# Patient Record
Sex: Female | Born: 1960 | Race: Asian | Hispanic: No | Smoking: Never smoker
Health system: Southern US, Community
[De-identification: ages and names within clinical notes are randomized; demographics above are authoritative.]

## PROBLEM LIST (undated history)

## (undated) DIAGNOSIS — C50412 Malignant neoplasm of upper-outer quadrant of left female breast: Principal | ICD-10-CM

## (undated) DIAGNOSIS — Z9010 Acquired absence of unspecified breast and nipple: Secondary | ICD-10-CM

## (undated) HISTORY — DX: Malignant neoplasm of upper-outer quadrant of left female breast: C50.412

## (undated) HISTORY — PX: DILATATION & CURETTAGE/HYSTEROSCOPY WITH MYOSURE: SHX6511

## (undated) HISTORY — DX: Acquired absence of unspecified breast and nipple: Z90.10

---

## 1998-01-02 ENCOUNTER — Other Ambulatory Visit: Admission: RE | Admit: 1998-01-02 | Discharge: 1998-01-02 | Payer: Self-pay | Admitting: Obstetrics and Gynecology

## 1998-02-11 ENCOUNTER — Inpatient Hospital Stay (HOSPITAL_COMMUNITY): Admission: AD | Admit: 1998-02-11 | Discharge: 1998-02-11 | Payer: Self-pay | Admitting: Obstetrics and Gynecology

## 1998-02-14 ENCOUNTER — Ambulatory Visit (HOSPITAL_COMMUNITY): Admission: RE | Admit: 1998-02-14 | Discharge: 1998-02-14 | Payer: Self-pay

## 1998-11-04 ENCOUNTER — Other Ambulatory Visit: Admission: RE | Admit: 1998-11-04 | Discharge: 1998-11-04 | Payer: Self-pay | Admitting: Obstetrics and Gynecology

## 1999-03-17 ENCOUNTER — Inpatient Hospital Stay (HOSPITAL_COMMUNITY): Admission: AD | Admit: 1999-03-17 | Discharge: 1999-03-17 | Payer: Self-pay | Admitting: Obstetrics and Gynecology

## 1999-03-18 ENCOUNTER — Inpatient Hospital Stay (HOSPITAL_COMMUNITY): Admission: AD | Admit: 1999-03-18 | Discharge: 1999-03-18 | Payer: Self-pay | Admitting: Obstetrics and Gynecology

## 1999-04-14 ENCOUNTER — Inpatient Hospital Stay (HOSPITAL_COMMUNITY): Admission: AD | Admit: 1999-04-14 | Discharge: 1999-04-14 | Payer: Self-pay | Admitting: Obstetrics and Gynecology

## 1999-05-31 ENCOUNTER — Inpatient Hospital Stay (HOSPITAL_COMMUNITY): Admission: AD | Admit: 1999-05-31 | Discharge: 1999-06-02 | Payer: Self-pay | Admitting: Obstetrics and Gynecology

## 1999-06-04 ENCOUNTER — Encounter: Admission: RE | Admit: 1999-06-04 | Discharge: 1999-09-02 | Payer: Self-pay | Admitting: Obstetrics and Gynecology

## 2000-07-07 ENCOUNTER — Other Ambulatory Visit: Admission: RE | Admit: 2000-07-07 | Discharge: 2000-07-07 | Payer: Self-pay | Admitting: Obstetrics and Gynecology

## 2003-01-18 ENCOUNTER — Other Ambulatory Visit: Admission: RE | Admit: 2003-01-18 | Discharge: 2003-01-18 | Payer: Self-pay | Admitting: Obstetrics and Gynecology

## 2003-12-04 ENCOUNTER — Encounter: Admission: RE | Admit: 2003-12-04 | Discharge: 2003-12-04 | Payer: Self-pay | Admitting: Allergy

## 2004-02-01 ENCOUNTER — Other Ambulatory Visit: Admission: RE | Admit: 2004-02-01 | Discharge: 2004-02-01 | Payer: Self-pay | Admitting: Obstetrics and Gynecology

## 2005-03-17 ENCOUNTER — Other Ambulatory Visit: Admission: RE | Admit: 2005-03-17 | Discharge: 2005-03-17 | Payer: Self-pay | Admitting: Obstetrics and Gynecology

## 2011-12-03 ENCOUNTER — Other Ambulatory Visit: Payer: Self-pay | Admitting: Obstetrics and Gynecology

## 2011-12-03 DIAGNOSIS — N6320 Unspecified lump in the left breast, unspecified quadrant: Secondary | ICD-10-CM

## 2011-12-07 ENCOUNTER — Ambulatory Visit
Admission: RE | Admit: 2011-12-07 | Discharge: 2011-12-07 | Disposition: A | Discharge status: Home / Self Care | Payer: 59 | Source: Ambulatory Visit | Attending: Obstetrics and Gynecology | Admitting: Obstetrics and Gynecology

## 2011-12-07 DIAGNOSIS — N6320 Unspecified lump in the left breast, unspecified quadrant: Secondary | ICD-10-CM

## 2013-09-21 ENCOUNTER — Encounter (INDEPENDENT_AMBULATORY_CARE_PROVIDER_SITE_OTHER): Payer: 59 | Admitting: Ophthalmology

## 2013-09-21 DIAGNOSIS — H35369 Drusen (degenerative) of macula, unspecified eye: Secondary | ICD-10-CM

## 2013-09-21 DIAGNOSIS — Q143 Congenital malformation of choroid: Secondary | ICD-10-CM

## 2013-09-21 DIAGNOSIS — H43819 Vitreous degeneration, unspecified eye: Secondary | ICD-10-CM

## 2013-09-21 DIAGNOSIS — H2510 Age-related nuclear cataract, unspecified eye: Secondary | ICD-10-CM

## 2014-06-21 ENCOUNTER — Other Ambulatory Visit: Payer: Self-pay | Admitting: Obstetrics and Gynecology

## 2014-06-21 DIAGNOSIS — R928 Other abnormal and inconclusive findings on diagnostic imaging of breast: Secondary | ICD-10-CM

## 2014-06-26 ENCOUNTER — Other Ambulatory Visit: Payer: Self-pay | Admitting: Obstetrics and Gynecology

## 2014-06-26 ENCOUNTER — Ambulatory Visit
Admission: RE | Admit: 2014-06-26 | Discharge: 2014-06-26 | Disposition: A | Discharge status: Home / Self Care | Payer: PRIVATE HEALTH INSURANCE | Source: Ambulatory Visit | Attending: Obstetrics and Gynecology | Admitting: Obstetrics and Gynecology

## 2014-06-26 DIAGNOSIS — R928 Other abnormal and inconclusive findings on diagnostic imaging of breast: Secondary | ICD-10-CM

## 2014-06-26 DIAGNOSIS — N6320 Unspecified lump in the left breast, unspecified quadrant: Secondary | ICD-10-CM

## 2014-06-26 IMAGING — US US BREAST*L* LIMITED INC AXILLA
1 series · 13 of 22 positions shown · non-contrast
Comparison: [DATE] and prior mammograms dating back to
[DATE].

CLINICAL DATA: 54-year-old female with possible left breast mass
and distortion identified on recent screening mammogram.

EXAM:
DIGITAL DIAGNOSTIC LEFT MAMMOGRAM WITH 3D TOMOSYNTHESIS WITH CAD
ULTRASOUND LEFT BREAST

[Series 1: advbreast · 13 of 22 slices shown]
[im 1/22]
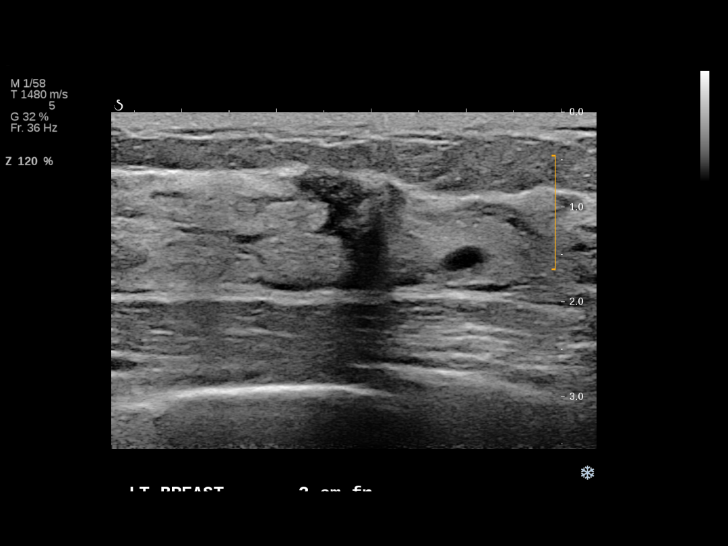
[im 3/22]
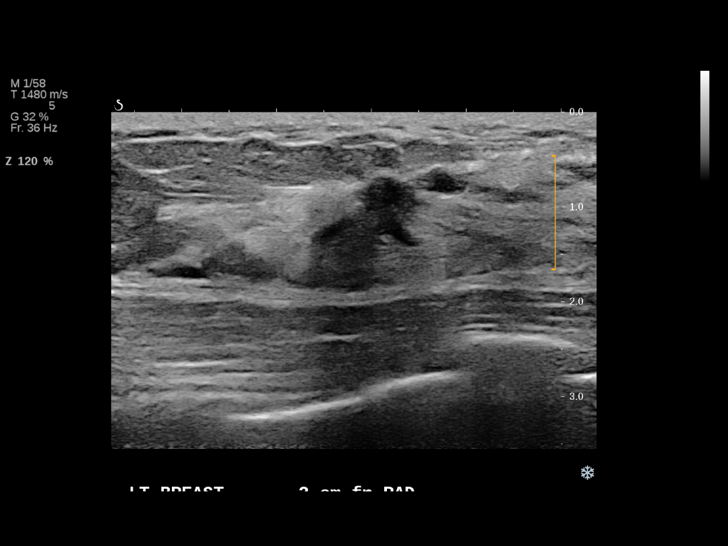
[im 5/22]
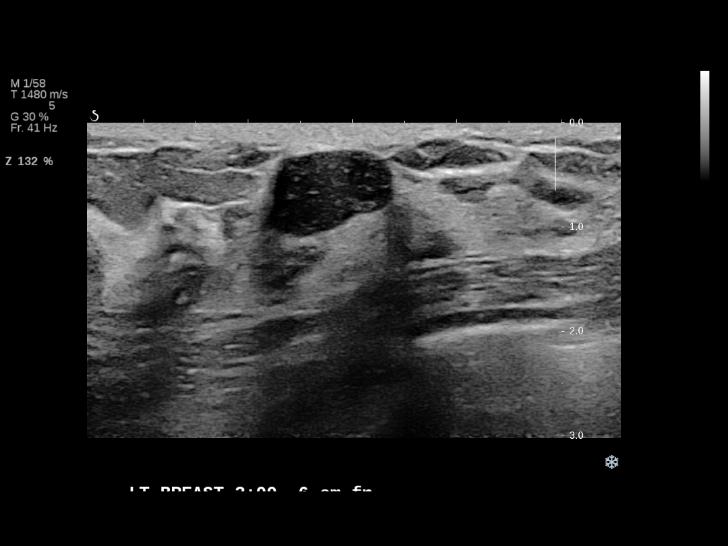
[im 6/22]
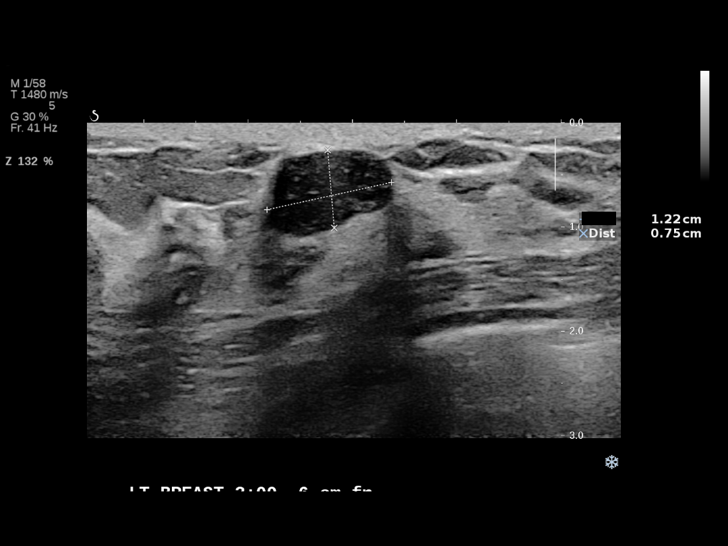
[im 8/22]
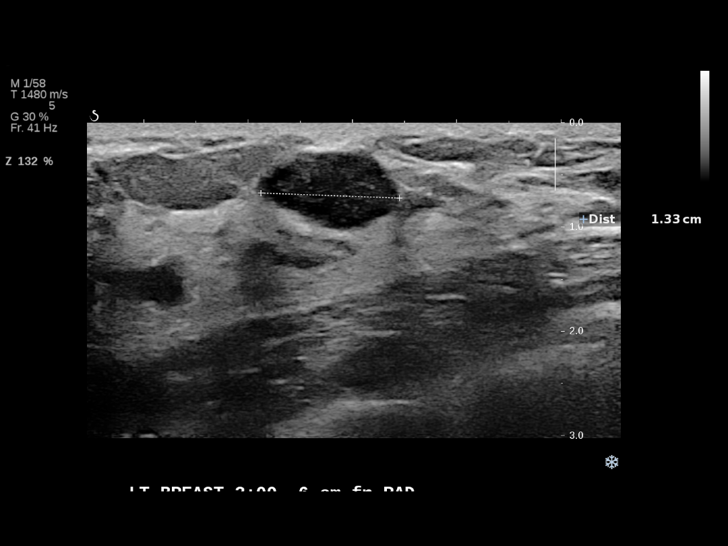
[im 10/22]
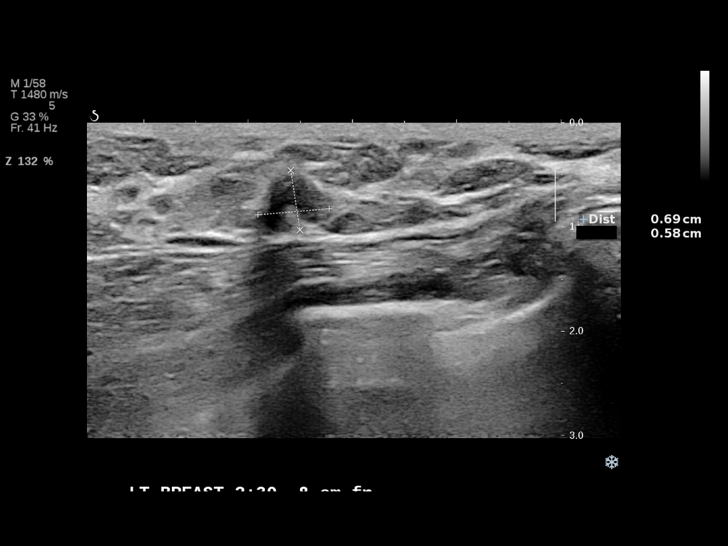
[im 12/22]
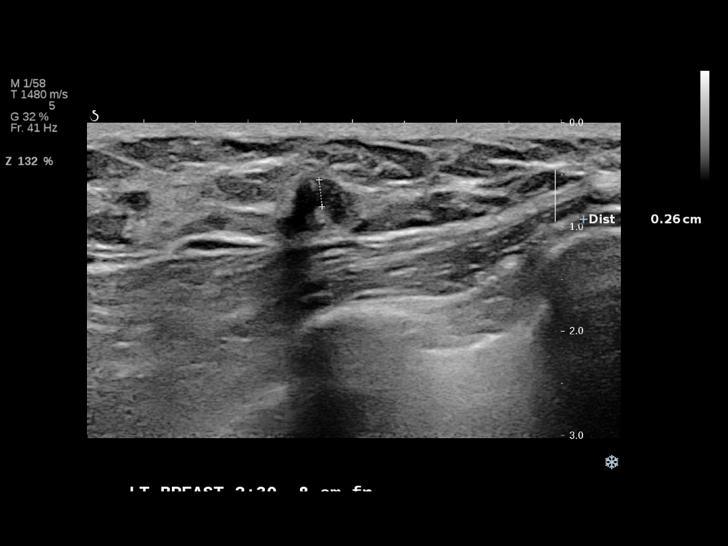
[im 13/22]
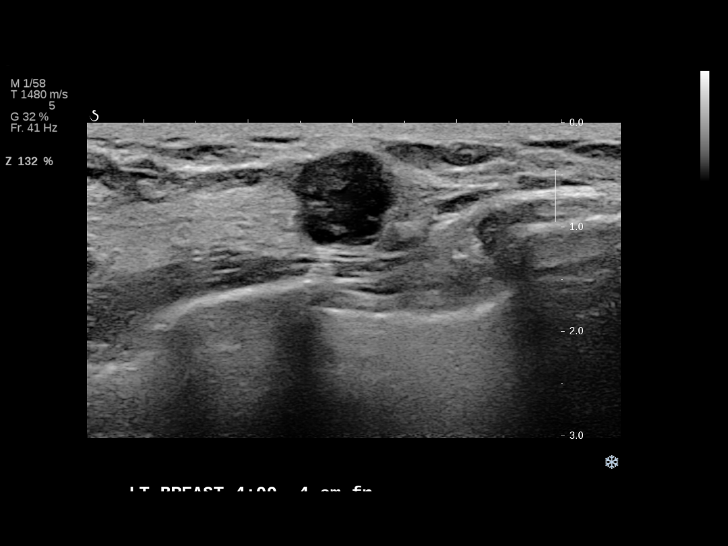
[im 15/22]
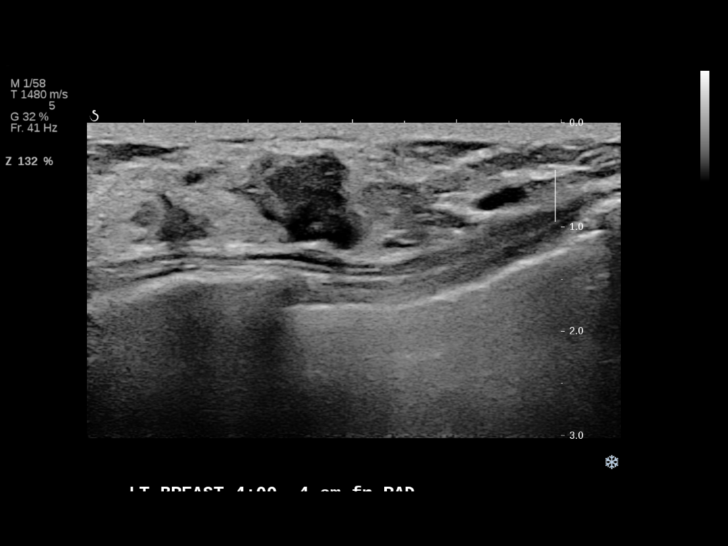
[im 17/22]
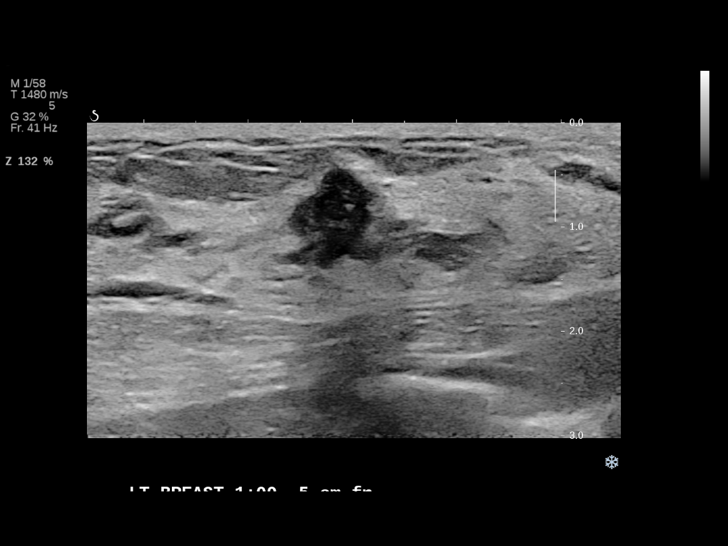
[im 18/22]
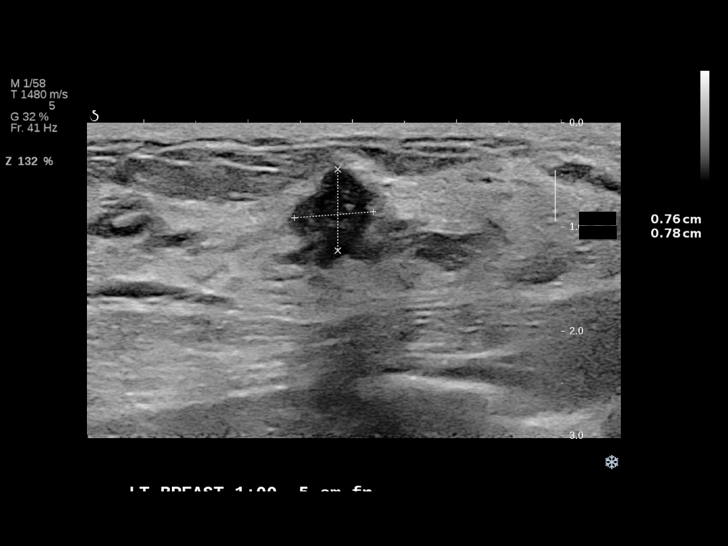
[im 20/22]
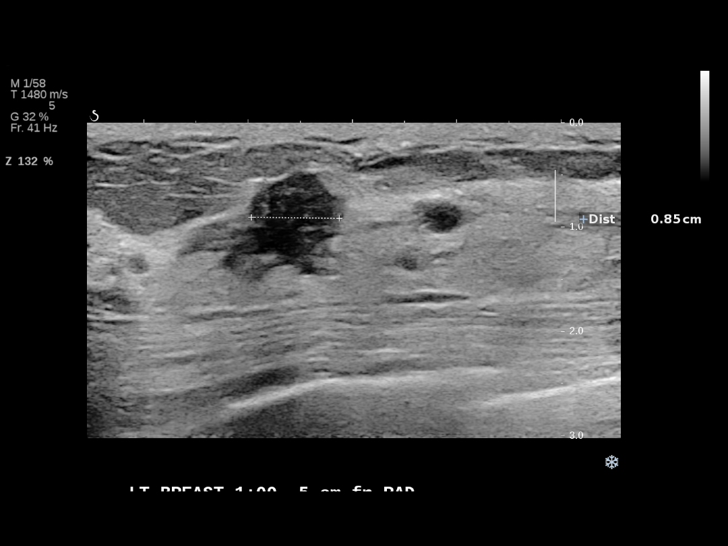
[im 22/22]
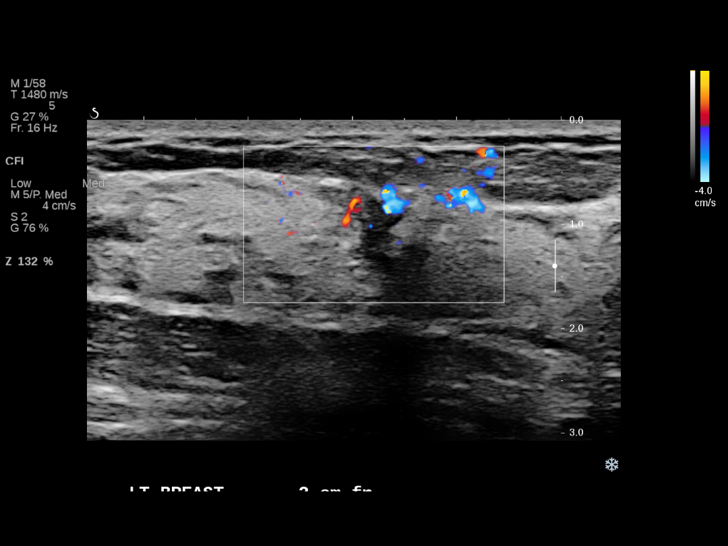

[13 of 22 positions shown; findings below may reference images not displayed]

[DATE] left breast ultrasound.

ACR Breast Density Category c: The breast tissue is heterogeneously
dense, which may obscure small masses.
FINDINGS: Routine 2D and 3D images of the left breast demonstrate a
circumscribed oval mass within the upper outer left breast and an
area of distortion within the upper left breast.

No suspicious calcifications are noted.

Mammographic images were processed with CAD.

On physical exam, nodularity throughout the left breast identified..

Ultrasound is performed, showing the following left breast findings:

A 1.2 x 1 x 1.3 cm suspicious irregular hypoechoic area/mass at the
[DATE] position 2 cm from the nipple, likely representing the
architectural distortion identified mammographically.

A 0.8 x 0.8 x 0.9 cm irregular hypoechoic mass at the 1 o'clock
position 5 cm from the nipple.

A 1 x 0.9 x 0.8 cm irregular hypoechoic mass at the 4 o'clock
position 4 cm from the nipple.

A mildly prominent intraparenchymal lymph node at the [DATE] position
of the left breast 8 cm from the nipple.

A 1.2 x 0.8 x 1.3 cm circumscribed oval hypoechoic parallel mass at
the 2 o'clock position of the left breast 6 cm from the nipple,
unchanged from [WR], compatible with a fibroadenoma.

No enlarged or abnormal appearing left axillary lymph nodes are
identified.
IMPRESSION: Suspicious masses at the [DATE] position, 1 o'clock position and 4
o'clock position of the left breast - tissue sampling of these
masses recommended. The [DATE] position mass probably represents the
architectural distortion identified mammographically.

No enlarged or abnormal appearing left axillary lymph nodes. Mildly
prominent intraparenchymal lymph node in the upper outer left
breast.

Stable fibroadenoma within the upper outer left breast,
corresponding to the mammographic abnormality.

RECOMMENDATION:
Ultrasound-guided biopsies of left breast masses as discussed above.
This has been scheduled for [DATE] and the patient informed.

I have discussed the findings and recommendations with the patient.
Results were also provided in writing at the conclusion of the
visit. If applicable, a reminder letter will be sent to the patient
regarding the next appointment.

BI-RADS CATEGORY  4: Suspicious.

## 2014-06-29 ENCOUNTER — Ambulatory Visit
Admission: RE | Admit: 2014-06-29 | Discharge: 2014-06-29 | Disposition: A | Discharge status: Home / Self Care | Payer: PRIVATE HEALTH INSURANCE | Source: Ambulatory Visit | Attending: Obstetrics and Gynecology | Admitting: Obstetrics and Gynecology

## 2014-06-29 ENCOUNTER — Other Ambulatory Visit: Payer: Self-pay | Admitting: Obstetrics and Gynecology

## 2014-06-29 DIAGNOSIS — N6320 Unspecified lump in the left breast, unspecified quadrant: Secondary | ICD-10-CM

## 2014-07-02 ENCOUNTER — Ambulatory Visit
Admission: RE | Admit: 2014-07-02 | Discharge: 2014-07-02 | Disposition: A | Discharge status: Home / Self Care | Payer: PRIVATE HEALTH INSURANCE | Source: Ambulatory Visit | Attending: Obstetrics and Gynecology | Admitting: Obstetrics and Gynecology

## 2014-07-02 ENCOUNTER — Other Ambulatory Visit: Payer: Self-pay | Admitting: Obstetrics and Gynecology

## 2014-07-02 DIAGNOSIS — N6320 Unspecified lump in the left breast, unspecified quadrant: Secondary | ICD-10-CM

## 2014-07-02 DIAGNOSIS — C50912 Malignant neoplasm of unspecified site of left female breast: Secondary | ICD-10-CM

## 2014-07-03 ENCOUNTER — Telehealth: Payer: Self-pay | Admitting: *Deleted

## 2014-07-03 ENCOUNTER — Encounter: Payer: Self-pay | Admitting: *Deleted

## 2014-07-03 ENCOUNTER — Other Ambulatory Visit: Payer: PRIVATE HEALTH INSURANCE

## 2014-07-03 DIAGNOSIS — C50412 Malignant neoplasm of upper-outer quadrant of left female breast: Secondary | ICD-10-CM

## 2014-07-03 DIAGNOSIS — Z170 Estrogen receptor positive status [ER+]: Secondary | ICD-10-CM | POA: Insufficient documentation

## 2014-07-03 HISTORY — DX: Malignant neoplasm of upper-outer quadrant of left female breast: C50.412

## 2014-07-03 NOTE — Telephone Encounter (Signed)
Confirmed [facility] for 07/11/14 at 8am .  Instructions and contact information given.

## 2014-07-04 ENCOUNTER — Ambulatory Visit
Admission: RE | Admit: 2014-07-04 | Discharge: 2014-07-04 | Disposition: A | Discharge status: Home / Self Care | Payer: BLUE CROSS/BLUE SHIELD | Source: Ambulatory Visit | Attending: Obstetrics and Gynecology | Admitting: Obstetrics and Gynecology

## 2014-07-04 DIAGNOSIS — C50912 Malignant neoplasm of unspecified site of left female breast: Secondary | ICD-10-CM

## 2014-07-04 MED ORDER — GADOBENATE DIMEGLUMINE 529 MG/ML IV SOLN
12.0000 mL | Freq: Once | INTRAVENOUS | Status: AC | PRN
  Administered 2014-07-04: 12 mL via INTRAVENOUS

## 2014-07-06 ENCOUNTER — Other Ambulatory Visit: Payer: Self-pay | Admitting: Obstetrics and Gynecology

## 2014-07-06 DIAGNOSIS — R928 Other abnormal and inconclusive findings on diagnostic imaging of breast: Secondary | ICD-10-CM

## 2014-07-09 ENCOUNTER — Ambulatory Visit
Admission: RE | Admit: 2014-07-09 | Discharge: 2014-07-09 | Disposition: A | Discharge status: Home / Self Care | Payer: BLUE CROSS/BLUE SHIELD | Source: Ambulatory Visit | Attending: Obstetrics and Gynecology | Admitting: Obstetrics and Gynecology

## 2014-07-09 ENCOUNTER — Other Ambulatory Visit: Payer: Self-pay | Admitting: Obstetrics and Gynecology

## 2014-07-09 ENCOUNTER — Encounter: Payer: Self-pay | Admitting: *Deleted

## 2014-07-09 DIAGNOSIS — R928 Other abnormal and inconclusive findings on diagnostic imaging of breast: Secondary | ICD-10-CM

## 2014-07-11 ENCOUNTER — Ambulatory Visit: Payer: BLUE CROSS/BLUE SHIELD

## 2014-07-11 ENCOUNTER — Encounter: Payer: Self-pay | Admitting: Hematology

## 2014-07-11 ENCOUNTER — Encounter: Payer: Self-pay | Admitting: Skilled Nursing Facility1

## 2014-07-11 ENCOUNTER — Ambulatory Visit (HOSPITAL_BASED_OUTPATIENT_CLINIC_OR_DEPARTMENT_OTHER): Payer: BLUE CROSS/BLUE SHIELD | Admitting: Hematology

## 2014-07-11 ENCOUNTER — Other Ambulatory Visit (HOSPITAL_BASED_OUTPATIENT_CLINIC_OR_DEPARTMENT_OTHER): Payer: BLUE CROSS/BLUE SHIELD

## 2014-07-11 ENCOUNTER — Ambulatory Visit
Admission: RE | Admit: 2014-07-11 | Discharge: 2014-07-11 | Disposition: A | Discharge status: Home / Self Care | Payer: BLUE CROSS/BLUE SHIELD | Source: Ambulatory Visit | Attending: Radiation Oncology | Admitting: Radiation Oncology

## 2014-07-11 ENCOUNTER — Ambulatory Visit: Payer: BLUE CROSS/BLUE SHIELD | Attending: General Surgery | Admitting: Physical Therapy

## 2014-07-11 VITALS — BP 127/70 | HR 73 | Temp 97.1°F | Resp 18 | Ht 64.5 in | Wt 128.9 lb

## 2014-07-11 DIAGNOSIS — C50412 Malignant neoplasm of upper-outer quadrant of left female breast: Secondary | ICD-10-CM

## 2014-07-11 DIAGNOSIS — N63 Unspecified lump in breast: Secondary | ICD-10-CM | POA: Diagnosis not present

## 2014-07-11 DIAGNOSIS — C50512 Malignant neoplasm of lower-outer quadrant of left female breast: Secondary | ICD-10-CM

## 2014-07-11 DIAGNOSIS — C50812 Malignant neoplasm of overlapping sites of left female breast: Secondary | ICD-10-CM

## 2014-07-11 DIAGNOSIS — Z170 Estrogen receptor positive status [ER+]: Secondary | ICD-10-CM

## 2014-07-11 LAB — CBC WITH DIFFERENTIAL/PLATELET
BASO%: 0.7 % (ref 0.0–2.0)
Basophils Absolute: 0 10*3/uL (ref 0.0–0.1)
EOS%: 3.8 % (ref 0.0–7.0)
Eosinophils Absolute: 0.2 10*3/uL (ref 0.0–0.5)
HEMATOCRIT: 40.2 % (ref 34.8–46.6)
HGB: 13.3 g/dL (ref 11.6–15.9)
LYMPH#: 1.7 10*3/uL (ref 0.9–3.3)
LYMPH%: 30.5 % (ref 14.0–49.7)
MCH: 29.8 pg (ref 25.1–34.0)
MCHC: 33 g/dL (ref 31.5–36.0)
MCV: 90.2 fL (ref 79.5–101.0)
MONO#: 0.3 10*3/uL (ref 0.1–0.9)
MONO%: 5.3 % (ref 0.0–14.0)
NEUT#: 3.3 10*3/uL (ref 1.5–6.5)
NEUT%: 59.7 % (ref 38.4–76.8)
Platelets: 263 10*3/uL (ref 145–400)
RBC: 4.46 10*6/uL (ref 3.70–5.45)
RDW: 12.6 % (ref 11.2–14.5)
WBC: 5.6 10*3/uL (ref 3.9–10.3)

## 2014-07-11 LAB — COMPREHENSIVE METABOLIC PANEL (CC13)
ALK PHOS: 45 U/L (ref 40–150)
ALT: 12 U/L (ref 0–55)
AST: 22 U/L (ref 5–34)
Albumin: 3.6 g/dL (ref 3.5–5.0)
Anion Gap: 7 mEq/L (ref 3–11)
BUN: 5.4 mg/dL — AB (ref 7.0–26.0)
CHLORIDE: 105 meq/L (ref 98–109)
CO2: 28 mEq/L (ref 22–29)
Calcium: 9.1 mg/dL (ref 8.4–10.4)
Creatinine: 0.6 mg/dL (ref 0.6–1.1)
EGFR: >90 mL/min/{1.73_m2} (ref >90)
GLUCOSE: 92 mg/dL (ref 70–140)
POTASSIUM: 3.6 meq/L (ref 3.5–5.1)
Sodium: 140 mEq/L (ref 136–145)
Total Bilirubin: 0.61 mg/dL (ref 0.20–1.20)
Total Protein: 6.3 g/dL — ABNORMAL LOW (ref 6.4–8.3)

## 2014-07-11 NOTE — Progress Notes (Signed)
Checked in new pt with no financial concerns prior to seeing the dr.  Informed pt if chemo is part of her treatment we will call her ins to see if Josem Kaufmann is req and will obtain it if it is as well as contact foundations that offer copay assistance for chemo if needed.  She has my card for any billing questions or concerns.

## 2014-07-11 NOTE — Progress Notes (Addendum)
[facility]  [phone] Fax:([phone]) [place] Note   Patient Care Team: Louretta Shorten, MD as PCP - General (Obstetrics and Gynecology) Louretta Shorten, MD as Consulting Physician (Obstetrics and Gynecology) Autumn Messing III, MD as Consulting Physician (General Surgery) Truitt Merle, MD as Consulting Physician (Hematology) Thea Silversmith, MD as Consulting Physician (Radiation Oncology) Rockwell Germany, RN as Registered Nurse Mauro Kaufmann, RN as Registered Nurse Holley Bouche, NP as Nurse Practitioner (Nurse Practitioner) 07/11/2014  CHIEF COMPLAINTS/PURPOSE OF CONSULTATION:  Newly diagnosed left breast cancer    Breast cancer of upper-outer quadrant of left female breast   06/26/2014 Breast US A 1 x 0.9 x 0.8 cm irregular hypoechoic mass at the 4 o'clock position 4 cm from the nipple.Suspicious masses at the 12:30 position, 1 o'clock position and 4 o'clock position of the left breast - tissue sampling of thesemasses recommended.    06/26/2014 Breast US A 1.2 x 1 x 1.3 cm suspicious irregular hypoechoic area/mass at the 12:30 position 2 cm from the nipple, likely representing the architectural distortion identified mammographically. A 0.8 x 0.8 x 0.9 cm irregular hypoechoic mass at the 1 o'clock    07/02/2014 Initial Biopsy 1. Breast, left, needle core biopsy, 1, 12:30 and 4  o'clock position - INVASIVE MAMMARY CARCINOMA. - MAMMARY CARCINOMA IN SITU.    07/02/2014 Receptors her2 Estrogen Receptor: 100%, POSITIVE, STRONG STAINING INTENSITY Progesterone Receptor: 98%, POSITIVE, STRONG STAINING INTENSITY Proliferation Marker Ki67: 10%, HER-2/NEU BY CISH - NEGATIVE.   07/03/2014 Initial Diagnosis Breast cancer of upper-outer quadrant of left female breast   07/10/2014 Breast MRI 1. Biopsy marker clip artifact in the 12:30 o'clock position of the left breast at the location of biopsy-proven invasive mammary carcinoma without a separate visible enhancing mass at this  time.    07/10/2014 Breast MRI 2. 1.3 cm biopsy-proven invasive mammary carcinoma in the midportion of the upper-outer quadrant of the left breast. 3. 1.3 cm biopsy-proven invasive mammary carcinoma in the posterior aspect of the lower outer quadrant of the left breast.    07/10/2014 Breast MRI 4. 0.8 cm rounded, mildly irregular enhancing mass in the posterior aspect of the upper-outer quadrant of the left breast, 0.8 cm similar-appearing mass in the posterior aspect of the lower outer quadrant of the left breast and adjacent 0.8 cm similar-   07/10/2014 Breast MRI 1.4 cm mildly irregular oval, enhancing mass in the anterior aspect of the lower inner quadrant of the right breast. This is suspicious for the possibility of malignancy.    HISTORY OF PRESENTING ILLNESS:  Stacy Gallagher 54 y.o. female is here because of newly diagnosed breast cancer  This was discovered by screening mammogram. Her last mammogram was 2 years ago which was normal. Her diagnostic mammogram showed 3 lesions in the left breast, measuring up to 1.3 cm, with the largest distance about 5.3 cm. 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 Ki-67 6-11%. [name] on 07/10/2014 showed 3 additional small lesions in the left breast and a 1.4 cm mass in the right breast, she is scheduled to have a right breast mass biopsy on April 18.   She denies any palpable mass in the breast or axilla before the screening. She feels very well overall, denies any pain, fatigue, change of her appetite or weight loss lately. She denies any other symptoms.  MEDICAL HISTORY:  Past  Medical History  Diagnosis Date  . Breast cancer of upper-outer quadrant of left female breast 07/03/2014    SURGICAL HISTORY: Past Surgical History  Procedure Laterality Date  . Dilatation & curettage/hysteroscopy with myosure     GYN HISTORY  Menarchal:  13 LMP: 06/17/2014 Contraceptive: HRT: n/a G3P2, 1 miscarriage  SOCIAL HISTORY: History   Social History  . Marital Status: Married    Spouse Name: N/A  . Number of Children: 2, age of 46 and 47   . Years of Education: N/A   Occupational History  .  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 Smoking status: Never Smoker   . Smokeless tobacco: Not on file  . Alcohol Use: No  . Drug Use: No  . Sexual Activity: Not on file   Other Topics Concern  . Not on file   Social History Narrative    FAMILY HISTORY: Family History  Problem Relation Age of Onset  . Colon cancer Maternal Aunt   . Cancer Maternal Aunt 65    colon cancer   . Throat cancer Maternal Grandmother   . Cancer Maternal Grandmother     ALLERGIES:  has No Known Allergies.  MEDICATIONS:  None  REVIEW OF SYSTEMS:   Constitutional: Denies fevers, chills or abnormal night sweats Eyes: Denies blurriness of vision, double vision or watery eyes Ears, nose, mouth, throat, and face: Denies mucositis or sore throat Respiratory: Denies cough, dyspnea or wheezes Cardiovascular: Denies palpitation, chest discomfort or lower extremity swelling Gastrointestinal:  Denies nausea, heartburn or change in bowel habits Skin: Denies abnormal skin rashes Lymphatics: Denies new lymphadenopathy or easy bruising Neurological:Denies numbness, tingling or new weaknesses Behavioral/Psych: Mood is stable, no new changes  All other systems were reviewed with the patient and are negative.  PHYSICAL EXAMINATION: ECOG PERFORMANCE STATUS: 0 - Asymptomatic  Filed Vitals:   07/11/14 0828  BP: 127/70  Pulse: 73  Temp: 97.1 F (36.2 C)  Resp: 18   Filed Weights   07/11/14 0828  Weight: 128 lb 14.4 oz (58.469 kg)    GENERAL:alert, no distress and comfortable SKIN: skin color, texture, turgor are normal, no rashes or significant lesions EYES: normal, conjunctiva are pink and non-injected,  sclera clear OROPHARYNX:no exudate, no erythema and lips, buccal mucosa, and tongue normal  NECK: supple, thyroid normal size, non-tender, without nodularity LYMPH:  no palpable lymphadenopathy in the cervical, axillary or inguinal LUNGS: clear to auscultation and percussion with normal breathing effort HEART: regular rate & rhythm and no murmurs and no lower extremity edema ABDOMEN:abdomen soft, non-tender and normal bowel sounds Musculoskeletal:no cyanosis of digits and no clubbing  PSYCH: alert & oriented x 3 with fluent speech NEURO: no focal motor/sensory deficits Breasts: Breast inspection showed them to be symmetrical with no nipple discharge or skin change. Palpation of the breasts and axilla revealed a 1.0X1.5cm mass at the upper outer quadrant of left breast (responding to the fibroadenoma on ultrasound ), but otherwise no other palpable mass that I could appreciate.   LABORATORY DATA:  I have reviewed the data as listed Lab Results  Component Value Date   WBC 5.6 07/11/2014   HGB 13.3 07/11/2014   HCT 40.2 07/11/2014   MCV 90.2 07/11/2014   PLT 263 07/11/2014    Recent Labs  07/11/14 0805  NA 140  K 3.6  CO2 28  GLUCOSE 92  BUN 5.4*  CREATININE 0.6  CALCIUM 9.1  PROT 6.3*  ALBUMIN 3.6  AST 22  ALT 12  ALKPHOS 45  BILITOT 0.61   Pathology results Diagnosis 1. Breast, left, needle core biopsy, 1 o'clock position - INVASIVE MAMMARY CARCINOMA. - MAMMARY CARCINOMA IN SITU. 2. Breast, left, needle core biopsy, 12:30 o'clock position - INVASIVE MAMMARY CARCINOMA. - MAMMARY CARCINOMA IN SITU. 3. Breast, left, needle core biopsy, 4 o'clock position - INVASIVE MAMMARY CARCINOMA. - MAMMARY CARCINOMA IN SITU.  Results: IMMUNOHISTOCHEMICAL AND MORPHOMETRIC ANALYSIS BY THE AUTOMATED CELLULAR IMAGING SYSTEM (ACIS) Estrogen Receptor: 100%, POSITIVE, STRONG STAINING INTENSITY Progesterone Receptor: 98%, POSITIVE, STRONG STAINING INTENSITY Proliferation Marker Ki67:  10% Results: HER-2/NEU BY CISH - NEGATIVE. RESULT RATIO OF HER2: CEP 17 SIGNALS 1.28 AVERAGE HER2 COPY NUMBER PER CELL 2.50  RADIOGRAPHIC STUDIES: I have personally reviewed the radiological images as listed and agreed with the findings in the report.  Mr Breast Bilateral W Wo Contrast 07/10/2014   ADDENDUM REPORT: 07/10/2014 15:03  ADDENDUM: The right breast mass measures 1.4 x 0.7 x 0.4 cm.    07/10/2014   IMPRESSION: 1. Biopsy marker clip artifact in the 12:30 o'clock position of the left breast at the location of biopsy-proven invasive mammary carcinoma without a separate visible enhancing mass at this time. 2. 1.3 cm biopsy-proven invasive mammary carcinoma in the midportion of the upper-outer quadrant of the left breast. 3. 1.3 cm biopsy-proven invasive mammary carcinoma in the posterior aspect of the lower outer quadrant of the left breast. 4. 0.8 cm rounded, mildly irregular enhancing mass in the posterior aspect of the upper-outer quadrant of the left breast, 0.8 cm similar-appearing mass in the posterior aspect of the lower outer quadrant of the left breast and adjacent 0.8 cm similar-appearing mass in the lower outer quadrant of the left breast. These are all suspicious for additional areas of malignancy. 5. Sonographically stable 1.5 cm probable fibroadenoma in the upper outer quadrant of the left breast. 6. 1.4 cm mildly irregular oval, enhancing mass in the anterior aspect of the lower inner quadrant of the right breast. This is suspicious for the possibility of malignancy.  RECOMMENDATION: Targeted ultrasound of the lower inner quadrant of the right breast and ultrasound-guided core needle biopsy of the 1.4 cm mass seen at that location on the MRI. If this cannot be seen sonographically, MR guided core needle biopsy would be recommended. We will schedule the ultrasound and possible ultrasound-guided core needle biopsy for the patient.  BI-RADS CATEGORY  4: Suspicious.  Electronically  Signed: By: Claudie Revering M.D. On: 07/05/2014 16:25    Mm Radiologist Eval And Mgmt  07/02/2014   CHIEF COMPLAINT: The patient presents to discuss results of left breast biopsies, 3 locations.  Current Pain Level: 1  EXAM: ESTABLISHED PATIENT OFFICE VISIT - LEVEL III  HISTORY OF PRESENT ILLNESS: The patient underwent recent imaging workup demonstrating 3 abnormalities in the left breast, for which she underwent ultrasound-guided core biopsy.  REVIEW OF SYSTEMS: The patient reports no problems at the biopsy site.  FINDINGS: At physical exam, there is minimal ecchymosis at the 1230 location biopsy site but no other skin abnormality identified in the left breast, with good healing of the biopsy sites overall.  PATHOLOGY: Pathology demonstrates invasive mammary carcinoma at all 3 biopsy locations. This is concordant with the imaging appearance.  ASSESSMENT AND PLAN: ASSESSMENT AND PLAN Breast MRI has been scheduled 07/04/2014. [facility] been scheduled 07/11/2014. The patient reports no problems and all questions were answered. She was provided with educational materials.  Electronically Signed   By: Conchita Paris M.D.   On: 07/02/2014 13:56   US Breast Ltd Uni Left Inc Axilla  06/26/2014   CLINICAL DATA:  54 year old female with possible left breast mass and distortion identified on recent screening mammogram.  EXAM: DIGITAL DIAGNOSTIC LEFT MAMMOGRAM WITH 3D TOMOSYNTHESIS WITH CAD  ULTRASOUND LEFT BREAST  COMPARISON:  06/19/2014 and prior mammograms dating back to 11/20/2009. 12/07/2011 left breast ultrasound.  ACR Breast Density Category c: The breast tissue is heterogeneously dense, which may obscure small masses.  FINDINGS: Routine 2D and 3D images of the left breast demonstrate a circumscribed oval mass within the upper outer left breast and an area of distortion within the upper left breast.  No suspicious calcifications are noted.  Mammographic images were processed  with CAD.  On physical exam, nodularity throughout the left breast identified.  Ultrasound is performed, showing the following left breast findings:  A 1.2 x 1 x 1.3 cm suspicious irregular hypoechoic area/mass at the 12:30 position 2 cm from the nipple, likely representing the architectural distortion identified mammographically.  A 0.8 x 0.8 x 0.9 cm irregular hypoechoic mass at the 1 o'clock position 5 cm from the nipple.  A 1 x 0.9 x 0.8 cm irregular hypoechoic mass at the 4 o'clock position 4 cm from the nipple.  A mildly prominent intraparenchymal lymph node at the 2:30 position of the left breast 8 cm from the nipple.  A 1.2 x 0.8 x 1.3 cm circumscribed oval hypoechoic parallel mass at the 2 o'clock position of the left breast 6 cm from the nipple, unchanged from 2013, compatible with a fibroadenoma.  No enlarged or abnormal appearing left axillary lymph nodes are identified.   IMPRESSION: Suspicious masses at the 12:30 position, 1 o'clock position and 4 o'clock position of the left breast - tissue sampling of these masses recommended. The 12:30 position mass probably represents the architectural distortion identified mammographically.  No enlarged or abnormal appearing left axillary lymph nodes. Mildly prominent intraparenchymal lymph node in the upper outer left breast.  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  RECOMMENDATION: Ultrasound-guided biopsies of left breast masses as discussed above. This has been scheduled for 06/29/2014 and the patient informed.  I have discussed the findings and recommendations with the patient. Results were also provided in writing at the conclusion of the visit. If applicable, a reminder letter will be sent to the patient regarding the next appointment.  BI-RADS CATEGORY  4: Suspicious.   Electronically Signed   By: Margarette Canada M.D.   On: 06/26/2014 15:46   US Breast Ltd Uni Right Inc Axilla  07/09/2014    CLINICAL DATA:  Recent diagnosis of left breast cancer. Second-look ultrasound for abnormal enhancement in right breast seen on MRI.  EXAM: ULTRASOUND OF THE RIGHT BREAST  COMPARISON:  Previous exam(s).  FINDINGS: Targeted ultrasound is performed, showing no definite abnormal solid lesion that would correlate to the MRI finding.  IMPRESSION: Suspicious MRI finding of the right breast.  RECOMMENDATION: MRI guided core biopsy right breast lesion.  I have discussed the findings and recommendations with the patient. Results were also provided in writing at the conclusion of the visit. If applicable, a reminder letter will be sent to the patient regarding the next appointment.  BI-RADS CATEGORY  4: Suspicious abnormality - biopsy should be considered.   Electronically Signed   By: Abelardo Diesel M.D.   On: 07/09/2014 12:49   Mm Diag  Breast Tomo Uni Left  06/26/2014   CLINICAL DATA:  54 year old female with possible left breast mass and distortion identified on recent screening mammogram.  EXAM: DIGITAL DIAGNOSTIC LEFT MAMMOGRAM WITH 3D TOMOSYNTHESIS WITH CAD  ULTRASOUND LEFT BREAST  COMPARISON:  06/19/2014 and prior mammograms dating back to 11/20/2009. 12/07/2011 left breast ultrasound.  ACR Breast Density Category c: The breast tissue is heterogeneously dense, which may obscure small masses.  FINDINGS: Routine 2D and 3D images of the left breast demonstrate a circumscribed oval mass within the upper outer left breast and an area of distortion within the upper left breast.  No suspicious calcifications are noted.  Mammographic images were processed with CAD.  On physical exam, nodularity throughout the left breast identified.  Ultrasound is performed, showing the following left breast findings:  A 1.2 x 1 x 1.3 cm suspicious irregular hypoechoic area/mass at the 12:30 position 2 cm from the nipple, likely representing the architectural distortion identified mammographically.  A 0.8 x 0.8 x 0.9 cm irregular hypoechoic  mass at the 1 o'clock position 5 cm from the nipple.  A 1 x 0.9 x 0.8 cm irregular hypoechoic mass at the 4 o'clock position 4 cm from the nipple.  A mildly prominent intraparenchymal lymph node at the 2:30 position of the left breast 8 cm from the nipple.  A 1.2 x 0.8 x 1.3 cm circumscribed oval hypoechoic parallel mass at the 2 o'clock position of the left breast 6 cm from the nipple, unchanged from 2013, compatible with a fibroadenoma.  No enlarged or abnormal appearing left axillary lymph nodes are identified.  IMPRESSION: Suspicious masses at the 12:30 position, 1 o'clock position and 4 o'clock position of the left breast - tissue sampling of these masses recommended. The 12:30 position mass probably represents the architectural distortion identified mammographically.  No enlarged or abnormal appearing left axillary lymph nodes. Mildly prominent intraparenchymal lymph node in the upper outer left breast.  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  RECOMMENDATION: Ultrasound-guided biopsies of left breast masses as discussed above. This has been scheduled for 06/29/2014 and the patient informed.  I have discussed the findings and recommendations with the patient. Results were also provided in writing at the conclusion of the visit. If applicable, a reminder letter will be sent to the patient regarding the next appointment.  BI-RADS CATEGORY  4: Suspicious.   Electronically Signed   By: Margarette Canada M.D.   On: 06/26/2014 15:46     ASSESSMENT & PLAN:  54 year old premenopausal female without significant past medical history, was found to have bilateral breast masses on the screening mammogram.  1. Left multifocal invasive ductal carcinoma, [id number], Stage IA, ER/PR strongly positive, HER-2 negative, Ki-67 -11%, and background DCIS, grade 2 -She has about 6 lesions in the left breast on the MRI. 3 of them were biopsied, which she wore  morphologically similar, with strong ER/PR expression and low Ki-67. -We discussed her imaging findings and the biopsy results in great details. -Giving the multifocal lesions in the left breast, she is likely need a left mastectomy. She was seen by Dr. Marlou Starks  Today. -I recommend a Oncotype Dx test on the sample with the highest Ki-67, and we'll make a decision about adjuvant chemotherapy based on the Oncotype result. -Giving the strong ER and PR expression, I recommend adjuvant endocrine therapy with tamoxifen. If she experience menopause when she is on tamoxifen, I may switch her to aromatase inhibitor for additional 5  years. -She was also seen by radiation oncologist Dr. Pablo Ledger today.  2. Right breast lesion -She has a 1.4 cm breast region on [name]. -She is scheduled to have a biopsy next week. -Further recommendation will based on the biopsy results and molecule features.  Follow-up: -If her right breast lesion biopsy shows invasive breast cancer, and with different ER/PR/HER2 status than left breast cancer, then I'll meet her again next week. Otherwise I'll plan to see her back 3 weeks after her surgery to discuss her Oncotype results.   All questions were answered. The patient knows to call the clinic with any problems, questions or concerns. I spent 55 minutes counseling the patient face to face. The total time spent in the appointment was 60 minutes and more than 50% was on counseling.     Truitt Merle, MD 07/11/2014 1:18 PM

## 2014-07-11 NOTE — Patient Instructions (Signed)

## 2014-07-11 NOTE — Therapy (Signed)
[place], [place], [address] Phone: [phone]   Fax:  [phone]  Physical Therapy Evaluation  Patient Details  Name: Stacy Gallagher MRN: [id number] Date of Birth: 03-30-1961 Referring Provider:  Jovita Kussmaul, MD  Encounter Date: 07/11/2014      PT End of Session - 07/11/14 1214    Visit Number 1   Number of Visits 1   PT Start Time 9292   PT Stop Time 1100   PT Time Calculation (min) 25 min   Activity Tolerance Patient tolerated treatment well   Behavior During Therapy [facility] for tasks assessed/performed      Past Medical History  Diagnosis Date  . Breast cancer of upper-outer quadrant of left female breast 07/03/2014    Past Surgical History  Procedure Laterality Date  . Dilatation & curettage/hysteroscopy with myosure      There were no vitals filed for this visit.  Visit Diagnosis:  Carcinoma of upper-outer quadrant of left female breast - Plan: PT plan of care cert/re-cert      Subjective Assessment - 07/11/14 1204    Subjective Patient was seen today for a baseline assessment of her newly diagnosed left and possible right breast cancer.   Patient is accompained by: Family member   Pertinent History Diagnosed with left upper outer breast cancer on 07/02/14.  She then underwent an MRI and was found to have 3 masses in her left breast and a suspicious area in her right breast which will biopsied on 07/16/14.  Her left breast masses are located at 12:30, 1:30, and 4:30.  Ki67 varies from 6-11% in those masses.   Patient Stated Goals Learn shoulder ROM HEP and reduce lymphedema risk   Currently in Pain? No/denies            [facility] PT Assessment - 07/11/14 0001    Assessment   Medical Diagnosis Left breast cancer   Onset Date 07/02/14   Precautions   Precautions Other (comment)  Active breast cancer   Restrictions   Weight Bearing Restrictions No   Balance Screen   Has the patient  fallen in the past 6 months No   Has the patient had a decrease in activity level because of a fear of falling?  No   Is the patient reluctant to leave their home because of a fear of falling?  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, sits at desk   Leisure She does cardio 3-4x/wk for 15-20 min   Cognition   Overall Cognitive Status Within Functional Limits for tasks assessed   Posture/Postural Control   Posture/Postural Control No significant limitations   ROM / Strength   AROM / PROM / Strength AROM;Strength   AROM   AROM Assessment Site Shoulder   Right/Left Shoulder Right;Left   Right Shoulder Extension 52 Degrees   Right Shoulder Flexion 155 Degrees   Right Shoulder ABduction 175 Degrees   Right Shoulder Internal Rotation 73 Degrees   Right Shoulder External Rotation 86 Degrees   Left Shoulder Extension 52 Degrees   Left Shoulder Flexion 154 Degrees   Left Shoulder ABduction 169 Degrees   Left Shoulder Internal  Rotation 65 Degrees   Left Shoulder External Rotation 86 Degrees   Strength   Overall Strength Within functional limits for tasks performed           LYMPHEDEMA/ONCOLOGY QUESTIONNAIRE - 07/11/14 1211    Type   Cancer Type Left and possibly right breast cancer   Lymphedema Assessments   Lymphedema Assessments Upper extremities   Right Upper Extremity Lymphedema   10 cm Proximal to Olecranon Process 25.8 cm   Olecranon Process 22.4 cm   10 cm Proximal to Ulnar Styloid Process 20.6 cm   Just Proximal to Ulnar Styloid Process 14.4 cm   Across Hand at [employer] 17.9 cm   At [place] of 2nd Digit 5.8 cm   Left Upper Extremity Lymphedema   10 cm Proximal  to Olecranon Process 26.1 cm   Olecranon Process 22.5 cm   10 cm Proximal to Ulnar Styloid Process 19.6 cm   Just Proximal to Ulnar Styloid Process 14.4 cm   Across Hand at [employer] 18 cm   At [place] of 2nd Digit 5.5 cm       Patient was instructed today in a home exercise program today for post op shoulder range of motion. These included active assist shoulder flexion in sitting, scapular retraction, wall walking with shoulder abduction, and hands behind head external rotation.  She was encouraged to do these twice a day, holding 3 seconds and repeating 5 times when permitted by her physician.         PT Education - 07/11/14 1213    Education provided Yes   Education Details Post op shoulder ROM HEP and lymphedema risk reduction   Person(s) Educated Patient;Other (comment)  sister   Methods Explanation;Demonstration;Handout   Comprehension Verbalized understanding;Returned demonstration              Breast Clinic Goals - 07/11/14 1221    Patient will be able to verbalize understanding of pertinent lymphedema risk reduction practices relevant to her diagnosis specifically related to skin care.   Time 1   Period Days   Status Achieved   Patient will be able to return demonstrate and/or verbalize understanding of the post-op home exercise program related to regaining shoulder range of motion.   Time 1   Period Days   Status Achieved   Patient will be able to verbalize understanding of the importance of attending the postoperative After Breast Cancer Class for further lymphedema risk reduction education and therapeutic exercise.   Time 1   Period Days   Status Achieved              Plan - 07/11/14 1214    Clinical Impression Statement Patient was seen today for a baseline assessment of her newly diagnosed left breast cancer.  Her left breast has masses in various quadrants and will plan to have a left mastectomy and sentinel node biopsy with possible immediate  reconstruction.  Her right breast has an area that will be biopsied on 07/16/14.  The treatment plan for her right side will be determined after those results.  She will have oncotype testing and and anti-estrogen therapy.  She will benefit from physical therapy post-operatively to regain shoulder ROM and strength and prevent lymphedema.   Pt will benefit from skilled therapeutic intervention in order to improve on the following deficits Decreased range of motion;Increased edema;Pain;Impaired UE functional use;Decreased strength;Decreased knowledge of precautions   Rehab Potential Excellent   Clinical Impairments Affecting Rehab Potential none  PT Frequency One time visit   PT Treatment/Interventions Patient/family education;Therapeutic exercise   Consulted and Agree with Plan of Care Patient;Family member/caregiver   Family Member Consulted sister     Patient will follow up at outpatient cancer rehab if needed following surgery.  If the patient requires physical therapy at that time, a specific plan will be dictated and sent to the referring physician for approval. The patient was educated today on appropriate basic range of motion exercises to begin post operatively and the importance of attending the After Breast Cancer class following surgery.  Patient was educated today on lymphedema risk reduction practices as it pertains to recommendations that will benefit the patient immediately following surgery.  She verbalized good understanding.  No additional physical therapy is indicated at this time.       Problem List Patient Active Problem List   Diagnosis Date Noted  . Breast cancer of upper-outer quadrant of left female breast 07/03/2014    Annia Friendly, PT 07/11/2014, 12:24 PM  [place], [place], [address] Phone: [phone]   Fax:  [phone]

## 2014-07-11 NOTE — Progress Notes (Signed)
Ms. Eagleson is a very pleasant 54 y.o. 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  There are 3 confirmed foci of disease identified in the left breast. Biopsy results revealed the tumors' prognostic profiles are all ER positive, PR positive, and HER2/neu negative. Ki67 ranges from 6-11%.  She presents today with her sister to the [facility]) for treatment consideration and recommendations from the breast surgeon, radiation oncologist, and medical oncologist.     I briefly met with Ms. Maino and her sister during her [facility] visit today. We discussed the purpose of the Survivorship Clinic, which will include monitoring for recurrence, coordinating completion of age and gender-appropriate cancer screenings, promotion of overall wellness, as well as managing potential late/long-term side effects of anti-cancer treatments.    The treatment plan for Ms. Zelada will likely include surgery, radiation therapy, and anti-estrogen therapy.  As of today, the intent of treatment for Ms. Domanski is cure, therefore she will be eligible for the Survivorship Clinic upon her completion of treatment.  Her survivorship care plan (SCP) document will be drafted and updated throughout the course of her treatment trajectory. She will receive the SCP in an office visit with myself in the Survivorship Clinic once she has completed treatment.   Ms. Saxe was encouraged to ask questions and all questions were answered to her satisfaction.  She was given my business card and encouraged to contact me with any concerns regarding survivorship.  I look forward to participating in her care.   Mike Craze, NP [place] [phone]

## 2014-07-11 NOTE — Progress Notes (Signed)
Subjective:     Patient ID: Stacy Gallagher, female   DOB: 22-Mar-1961, 54 y.o.   MRN: [id number]  HPI   Review of Systems     Objective:   Physical Exam For the patient to understand and be given the tools to implement a healthy plant based diet during their cancer diagnosis.     Assessment:     Patient was seen today and found to be pleasant and accompanied by her seemingly supportive sister. Pts left breast affected. Current labs: total protein 6.3 BUN 5.4.  Pts weight 128 pounds BMI 21.8 5'4''. Pt states she loves steak and feels she gets enough physical activity.      Plan:     Dietitian educated the patient on implementing a plant based diet by incorporating more plant proteins, fruits, and vegetables. As a part of a healthy routine physical activity was discussed. Dietitian explained the low protein labs and educated on the importance of consuming enough protein throughout the day. The importance of legitimate, evidence based information was discussed and examples were given. A folder of evidence based information with a focus on a plant based diet and general nutrition during cancer was given to the patient.  As a part of the continuum of care the cancer dietitian's contact information was given to the patient in the event they would like to have a follow up appointment.

## 2014-07-11 NOTE — Progress Notes (Signed)
  Radiation Oncology         [phone]) [phone] ________________________________  Initial Outpatient Consultation - Date: 07/11/2014   Name: Stacy Gallagher MRN: [id number]   DOB: April 20, 1960  REFERRING PHYSICIAN: Jovita Kussmaul, MD  DIAGNOSIS:    ICD-9-CM ICD-10-CM   1. Breast cancer of upper-outer quadrant of left female breast 174.4 C50.412     STAGE: No matching staging information was found for the patient.  HISTORY OF PRESENT ILLNESS::Stacy Gallagher is a 54 y.o. female  Was found to have distortion and a mass in her left breast on screening mammogram ultrasound showed 3 masses in the left breast . All of these were biopsied and the largest measured 1.3 cm. The clips were 5.3 cm apart. MRI showed multiple masses in the left breast including the largest at 1.3 cm.  In addition to the 3 known masses 3 additional masses were seen with the largest measuring 8 mm. A 1.4 cm mass was seen in the right breast. MRI biopsy of this mass is scheduled for 4/18. Biopsy of the known masses in the left breast showed similar appearing IDC which was Grade 2 and ER+PR+HEr2- with a Ki67 of 6-11%. She is GxP2 with menses at 12. She has done well with her biopsy. She is accompanied by her husband. She is still menstruating.   PREVIOUS RADIATION THERAPY: No  Past medical, social and family history were reviewed in the electronic chart. Review of symptoms was reviewed in the electronic chart. Medications were reviewed in the electronic chart.   PHYSICAL EXAM: There were no vitals filed for this visit.. . Pleasant female. No distress. Appears younger than her stated age.   IMPRESSION: Multifocal T1N0 Invasive Ductal Carcinoma of the Left Breast  PLAN: I spoke to the patient today regarding her diagnosis and options for treatment. We discussed the equivalence in terms of survival and local failure between mastectomy and breast conservation. We discussed the role of radiation in decreasing local failures in patients who  undergo mastectomy and have risk factors for recurrence including positive lymph nodes and/or tumors over 5 cm and/or positive margins. We discussed the process of simulation and the placement tattoos. We discussed 6 weeks of treatment as an outpatient. We discussed the possibility of asymptomatic lung damage. We discussed the low likelihood of secondary malignancies. We discussed the possible side effects including but not limited to skin redness, fatigue, permanent skin darkening, and chest wall swelling. We discussed increased complications that can occur with reconstruction after radiation.    She will require a mastectomy on the left and could have immediate reconstruction.   I spent 20 minutes  face to face with the patient and more than 50% of that time was spent in counseling and/or coordination of care.   ------------------------------------------------  Thea Silversmith, MD  j

## 2014-07-16 ENCOUNTER — Ambulatory Visit: Payer: BLUE CROSS/BLUE SHIELD

## 2014-07-16 ENCOUNTER — Ambulatory Visit
Admission: RE | Admit: 2014-07-16 | Discharge: 2014-07-16 | Disposition: A | Discharge status: Home / Self Care | Payer: BLUE CROSS/BLUE SHIELD | Source: Ambulatory Visit | Attending: Obstetrics and Gynecology | Admitting: Obstetrics and Gynecology

## 2014-07-16 DIAGNOSIS — R928 Other abnormal and inconclusive findings on diagnostic imaging of breast: Secondary | ICD-10-CM

## 2014-07-16 MED ORDER — GADOBENATE DIMEGLUMINE 529 MG/ML IV SOLN
12.0000 mL | Freq: Once | INTRAVENOUS | Status: AC | PRN
  Administered 2014-07-16: 12 mL via INTRAVENOUS

## 2014-07-17 ENCOUNTER — Telehealth: Payer: Self-pay | Admitting: *Deleted

## 2014-07-17 NOTE — Telephone Encounter (Signed)
Spoke with patient yesterday from [facility] 07/11/14.  She is doing well.  Confirmed appointment for her for Dr. Iran Planas for 4/19 at 245pm.  Encouraged her to call with any needs or concerns.

## 2014-07-24 ENCOUNTER — Encounter: Payer: Self-pay | Admitting: *Deleted

## 2014-07-24 ENCOUNTER — Telehealth: Payer: Self-pay | Admitting: *Deleted

## 2014-07-24 NOTE — Telephone Encounter (Signed)
Spoke with patient to follow up.  She states she is being referred to [facility] to get more information on DIEP flap. She states she will make a surgery decision after the appointment at [facility] on what type and where she will have her surgery.  Encouraged her to call with any needs or concerns.

## 2014-08-06 ENCOUNTER — Telehealth: Payer: Self-pay | Admitting: *Deleted

## 2014-08-06 NOTE — Telephone Encounter (Signed)
Left message for a return phone call to follow up with patient.   Awaiting patient response

## 2014-08-13 ENCOUNTER — Encounter: Payer: Self-pay | Admitting: Hematology

## 2014-08-29 HISTORY — PX: MASTECTOMY: SHX3

## 2014-10-04 ENCOUNTER — Telehealth: Payer: Self-pay | Admitting: *Deleted

## 2014-10-04 NOTE — Telephone Encounter (Signed)
Spoke with patient to follow up.  She states she had a DIEP at [facility] in June.  She is still recovering and didn't realize how involved the surgery was.  She has seen a medical oncologist at [facility] as well. Informed her that she can always contact me if the need arises.  Encouraged her to call if she had any questions or concerns. Patient was very appreciative of the follow up.

## 2015-02-06 ENCOUNTER — Ambulatory Visit: Payer: BLUE CROSS/BLUE SHIELD | Attending: Plastic Surgery | Admitting: Physical Therapy

## 2015-02-06 DIAGNOSIS — M25512 Pain in left shoulder: Secondary | ICD-10-CM | POA: Insufficient documentation

## 2015-02-06 DIAGNOSIS — M25612 Stiffness of left shoulder, not elsewhere classified: Secondary | ICD-10-CM | POA: Diagnosis present

## 2015-02-06 DIAGNOSIS — M6258 Muscle wasting and atrophy, not elsewhere classified, other site: Secondary | ICD-10-CM

## 2015-02-06 DIAGNOSIS — M6259 Muscle wasting and atrophy, not elsewhere classified, multiple sites: Secondary | ICD-10-CM | POA: Diagnosis present

## 2015-02-06 DIAGNOSIS — C50412 Malignant neoplasm of upper-outer quadrant of left female breast: Secondary | ICD-10-CM | POA: Insufficient documentation

## 2015-02-06 NOTE — Addendum Note (Signed)
Addended by: Kipp Laurence on: 02/06/2015 01:20 PM   Modules accepted: Orders

## 2015-02-06 NOTE — Therapy (Addendum)
[place], [place], [address] Phone: [phone]   Fax:  [phone]  Physical Therapy Evaluation  Patient Details  Name: Stacy Gallagher MRN: [id number] Date of Birth: 09-Sep-1960 No Data Recorded  Encounter Date: 02/06/2015      PT End of Session - 02/06/15 1224    Visit Number 1   Number of Visits 9   Date for PT Re-Evaluation 03/08/15   PT Start Time 1110   PT Stop Time 1150   PT Time Calculation (min) 40 min   Activity Tolerance Patient limited by pain   Behavior During Therapy [facility] for tasks assessed/performed      Past Medical History  Diagnosis Date  . Breast cancer of upper-outer quadrant of left female breast 07/03/2014    Past Surgical History  Procedure Laterality Date  . Dilatation & curettage/hysteroscopy with myosure      There were no vitals filed for this visit.  Visit Diagnosis:  Stiffness of shoulder joint, left - Plan: CANCELED: PT plan of care cert/re-cert  Left shoulder pain - Plan: CANCELED: PT plan of care cert/re-cert  Muscle atrophy of upper extremity - Plan: CANCELED: PT plan of care cert/re-cert      Subjective Assessment - 02/06/15 1116    Subjective I can't lift my arm up. Pt reports excurciating pain in left shoulder that she has had since surgery    Pertinent History Diagnosed with left upper outer breast cancer on 07/02/14.She has no chemotherapy and needs no radiation therapy.  She has a mastectomy with sentinel node resection with DIEP reconstruction on September 12, 2014. She has another sugery for plastic surgery follow up Octobber 26     Patient Stated Goals get left shoulder back to normal and have no pain   Currently in Pain? Yes   Pain Score 9   at times   Pain Location Shoulder   Pain Orientation Left   Pain Descriptors / Indicators Sharp   Pain Type Chronic pain   Pain Radiating Towards stays at top of shoulder for about a minute    Pain Onset More than  a month ago   Pain Frequency Intermittent   Aggravating Factors  moving certian ways   Pain Relieving Factors stop moving, hold her shoulder for a little bit.   Effect of Pain on Daily Activities can't wash hair, problems dressing    Multiple Pain Sites No            [facility] PT Assessment - 02/06/15 0001    Assessment   Medical Diagnosis Left breast cancer   Onset Date/Surgical Date 07/02/14   Precautions   Precautions Other (comment)   Restrictions   Weight Bearing Restrictions No   Balance Screen   Has the patient fallen in the past 6 months No   Has the patient had a decrease in activity level because of a fear of falling?  Yes  because of going through surgery    Is the patient reluctant to leave their home because of a fear of falling?  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[employer] returned  to gym since last surgery    Cognition   Overall Cognitive Status Within Functional Limits for tasks assessed   Observation/Other Assessments   Observations noted left interscapular atophy and scapular assymetry when trying to elevate arms   Skin Integrity healing incision along lower abdomen    Posture/Postural Control   Posture/Postural Control No significant limitations   AROM   Right Shoulder Extension --   Right Shoulder Flexion --   Right Shoulder ABduction --   Right Shoulder Internal Rotation --   Right Shoulder External Rotation --   Left Shoulder Extension --   Left Shoulder Flexion 60 Degrees   Left Shoulder ABduction 60 Degrees   Left Shoulder Internal Rotation 55 Degrees   Left Shoulder External Rotation 11 Degrees    Strength   Overall Strength Deficits;Due to pain   Overall Strength Comments  limited by pain and substitution from left upper trapezius with hip hiking    Left Shoulder Flexion 2+/5   Left Shoulder ABduction 2+/5   Left Shoulder Internal Rotation 2+/5   Left Shoulder External Rotation 2+/5   Palpation   Palpation comment Muscle tightness with trigger points noted at left upper trap.            LYMPHEDEMA/ONCOLOGY QUESTIONNAIRE - 02/06/15 1145    Left Upper Extremity Lymphedema   10 cm Proximal to Olecranon Process 25.5 cm   Olecranon Process 22.5 cm   10 cm Proximal to Ulnar Styloid Process 19.4 cm   Just Proximal to Ulnar Styloid Process 14.5 cm   Across Hand at [employer] 18 cm   At [place] of 2nd Digit 5.3 cm           Quick Dash - 02/06/15 0001    Open a tight or new jar Severe difficulty   Do heavy household chores (wash walls, wash floors) Unable   Carry a shopping bag or briefcase Severe difficulty   Wash your back Unable   Use a knife to cut food Severe difficulty   Recreational activities in which you take some force or impact through your arm, shoulder, or hand (golf, hammering, tennis) Unable   During the past week, to what extent has your arm, shoulder or hand problem interfered with your normal social activities with family, friends, neighbors, or groups? Quite a bit   During the past week, to what extent has your arm, shoulder or hand problem limited your work or other regular daily activities Extremely   Arm, shoulder, or hand pain. Extreme   Tingling (pins and needles) in your arm, shoulder, or hand Moderate   Difficulty Sleeping Moderate difficulty   DASH Score 81.82 %             [facility] Adult PT Treatment/Exercise - 02/06/15 0001    Shoulder Exercises: Standing   Other Standing Exercises Codman's exercise for left upper extremityl                PT Education - 02/06/15 1223    Education provided Yes   Education Details Codmans  exercise for beginning shoulder ROM   Person(s) Educated Patient   Methods Explanation;Demonstration   Comprehension Verbalized understanding;Returned demonstration           Plan - 02/06/15 1226    Clinical Impression Statement Ms. Stacy Gallagher comes to PT for help with her painful, stiff shoulder that she has had for several months and limits her activitie of daily living. She has muscle atrophy of left upper arm and  interscapular area. She has no signs of lymphedema at this point.   Anticipate she may have a slow recovery due to the degree of impairment, but she is motivated to return to normal activities.   Pt will benefit from skilled therapeutic intervention in order to improve on the following deficits Decreased range of motion;Decreased strength;Impaired perceived functional ability;Impaired UE functional use;Pain;Increased muscle spasms;Decreased activity tolerance;Decreased knowledge of precautions;Decreased knowledge of use of DME  delay use of estim until pt needs it for pain management    Rehab Potential Excellent   Clinical Impairments Affecting Rehab Potential none   PT Frequency 2x / week   PT Duration 4 weeks   PT Treatment/Interventions ADLs/Self Care Home Management;Electrical Stimulation;Therapeutic exercise;Therapeutic activities;Patient/family education;DME Instruction   PT Next Visit Plan  UE ranger...watch for upper trap activation,  scapular and glenohumeral mobilzataion, A/PROM to scapula, shoulder (especially external rotation)  Review codman's exercise and teach neck range of motion and shoulder shrugs and supine scapular protraction HEP   Consulted and Agree with Plan of Care Patient         Problem List Patient Active Problem List   Diagnosis Date Noted  . Breast cancer of upper-outer quadrant of left female breast ([place]) 07/03/2014   Donato Heinz. Owens Shark, PT   02/06/2015, 1:17 PM  [place], [place], [address] Phone: [phone]   Fax:  [phone]  Name: Stacy Gallagher MRN: [id number] Date of Birth: 10-08-1960

## 2015-02-12 ENCOUNTER — Ambulatory Visit: Payer: BLUE CROSS/BLUE SHIELD | Admitting: Physical Therapy

## 2015-02-12 DIAGNOSIS — M25612 Stiffness of left shoulder, not elsewhere classified: Secondary | ICD-10-CM | POA: Diagnosis not present

## 2015-02-12 DIAGNOSIS — M6258 Muscle wasting and atrophy, not elsewhere classified, other site: Secondary | ICD-10-CM

## 2015-02-12 DIAGNOSIS — M25512 Pain in left shoulder: Secondary | ICD-10-CM

## 2015-02-12 NOTE — Therapy (Signed)
Stacy Gallagher, [place], [address] Phone: [phone]   Fax:  [phone]  Physical Therapy Treatment  Patient Details  Name: Stacy Gallagher MRN: [id number] Date of Birth: 1961/03/18 No Data Recorded  Encounter Date: 02/12/2015      PT End of Session - 02/12/15 1210    Visit Number 2   Number of Visits 9   Date for PT Re-Evaluation 03/08/15   PT Start Time 1105   PT Stop Time 1146   PT Time Calculation (min) 41 min   Activity Tolerance Patient limited by pain   Behavior During Therapy [facility] for tasks assessed/performed      Past Medical History  Diagnosis Date  . Breast cancer of upper-outer quadrant of left female breast 07/03/2014    Past Surgical History  Procedure Laterality Date  . Dilatation & curettage/hysteroscopy with myosure      There were no vitals filed for this visit.  Visit Diagnosis:  Left shoulder pain  Stiffness of shoulder joint, left  Muscle atrophy of upper extremity      Subjective Assessment - 02/12/15 1114    Subjective Pt says she is having lots of pain in her shoulder especially when she tries to reach for something.  She has been doing the codmans exercises some and trying to strech    Pertinent History Diagnosed with left upper outer breast cancer on 07/02/14.She has no chemotherapy and needs no radiation therapy.  She has a mastectomy with sentinel node resection with DIEP reconstruction on September 12, 2014. She has another sugery for plastic surgery follow up Octobber 26     Patient Stated Goals get left shoulder back to normal and have no pain   Currently in Pain? Yes   Pain Score 2   at rest    Pain Orientation Left                         [facility] Adult PT Treatment/Exercise - 02/12/15 0001    Shoulder Exercises: Supine   Protraction AROM;Left;10 reps   Horizontal ABduction Strengthening;Left;5 reps   Theraband Level (Shoulder Horizontal ABduction)  Level 1 (Yellow)   External Rotation Strengthening;Left;5 reps   Theraband Level (Shoulder External Rotation) Level 1 (Yellow)   Flexion Strengthening;Left;5 reps  5 at narrow grip, 5 at wide grip   Theraband Level (Shoulder Flexion) Level 1 (Yellow)   Other Supine Exercises --  diagonal elevation with yellow theraband 5 reps both arms   Other Supine Exercises biceps and tricep exercise with not weight    Shoulder Exercises: Seated   Other Seated Exercises neck ROM and shourder shrugs    Other Seated Exercises UE ranger in mulitple planes with arm at only about 45 degrees flexion, but pt limited by pain    Shoulder Exercises: Sidelying   External Rotation AROM;Strengthening;Left;15 reps;Weights;Limitations   External Rotation Weight (lbs) isometrics x 5 with no weight, 5 with 1#, 5 with 2#, eccentrics x 5 with 2#    External Rotation Limitations did not go past parrallel to mat.   ABduction AROM;Left;5 reps   ABduction Limitations only 90 Degrees    Other Sidelying Exercises scapular retraction    Manual Therapy   Manual therapy comments tightness noted in upper traps and levator scapulae    Myofascial Release to trigger points and musclular tightness in left upper back and neck    Scapular Mobilization to left scapula in right sidelying in all planes  especially with stretch in inferior glide                 PT Education - 02/12/15 1208    Education provided Yes   Education Details supine scapular series with yellow theraband    Person(s) Educated Patient   Methods Explanation;Demonstration;Handout   Comprehension Verbalized understanding;Returned demonstration              Breast Clinic Goals - 07/11/14 1221    Patient will be able to verbalize understanding of pertinent lymphedema risk reduction practices relevant to her diagnosis specifically related to skin care.   Time 1   Period Days   Status Achieved   Patient will be able to return demonstrate and/or  verbalize understanding of the post-op home exercise program related to regaining shoulder range of motion.   Time 1   Period Days   Status Achieved   Patient will be able to verbalize understanding of the importance of attending the postoperative After Breast Cancer Class for further lymphedema risk reduction education and therapeutic exercise.   Time 1   Period Days   Status Achieved          Long Term Clinic Goals - 02/06/15 1308    CC Long Term Goal  #1   Title Patient will improve left shoulder abduction to 120 degrees so that she can wash her hair without difficulty  (target 03/07/2015)   Baseline 60   Time 4   Period Weeks   Status New   CC Long Term Goal  #2   Title Patient will report a decrease in pain by 50% so she  can perform daily activities with greater ease (target 03/05/2015)    Time 4   Period Weeks   Status New   CC Long Term Goal  #3   Title Patient will be independent in ahome exercise program ( 03/08/2015)    Time 4   Period Weeks   Status New   CC Long Term Goal  #4   Title Patient will decrease the DASH score to < 50  to demonstrate increased functional use of upper extremity(target 03/08/2015)   Baseline 81.82   Time 4   Period Weeks   Status New            Plan - 02/12/15 1213    Clinical Impression Statement Ms. Stacy Gallagher continues to have pain in her left shoulder that is limiting her dressing and other activities of daily living.  She admits to only doing limited exercise at home.  Today, treatment focuesed on incrasing mobiility of scapula and shoulder range of moition in pain free range.  She benefitted  from soft tissue work to upper trap area    Rehab Potential Excellent   Clinical Impairments Affecting Rehab Potential left mastectomy and DIEP reconstruction    PT Next Visit Plan continue with soft tissue work and left scapular mobilizaion and active range of motion with shoulder range withing pain free range. review supine scap exercise,  external rotation and protraction/retraction   Consulted and Agree with Plan of Care Patient        Problem List Patient Active Problem List   Diagnosis Date Noted  . Breast cancer of upper-outer quadrant of left female breast ([place]) 07/03/2014   Stacy Gallagher, PT  02/12/2015, 12:19 PM  [place], [place], [address] Phone: [phone]   Fax:  [phone]  Name: Stacy Gallagher MRN: [id number] Date  of Birth: [birth year]

## 2015-02-14 ENCOUNTER — Ambulatory Visit: Payer: BLUE CROSS/BLUE SHIELD | Admitting: Physical Therapy

## 2015-02-14 DIAGNOSIS — M6258 Muscle wasting and atrophy, not elsewhere classified, other site: Secondary | ICD-10-CM

## 2015-02-14 DIAGNOSIS — M25612 Stiffness of left shoulder, not elsewhere classified: Secondary | ICD-10-CM

## 2015-02-14 DIAGNOSIS — M25512 Pain in left shoulder: Secondary | ICD-10-CM

## 2015-02-14 NOTE — Patient Instructions (Signed)
Strengthening: Resisted Internal Rotation   Hold tubing in left hand, elbow at side and forearm out. Rotate forearm in across body. Repeat __10__ times per set. Do __1__ sets per session.  http://orth.exer.us/830   Copyright  VHI. All rights reserved.  Strengthening: Resisted External Rotation   Hold tubing in right hand, elbow at side and forearm across body. Rotate forearm out. Repeat __10__ times per set. Do __1__ sets per session. .  http://orth.exer.us/828   Copyright  VHI. All rights reserved.  Strengthening: Resisted Flexion   Hold tubing with left arm at side. Pull forward and up. Move shoulder through pain-free range of motion. Repeat _10__ times per set. Do _1___ sets per session.  http://orth.exer.us/824   Copyright  VHI. All rights reserved.  Strengthening: Resisted Extension   Hold tubing in right hand, arm forward. Pull arm back, elbow straight. Repeat _10___ times per set. Do __1__ sets per session.   http://orth.exer.us/832   Copyright  VHI. All rights reserved.

## 2015-02-14 NOTE — Therapy (Signed)
[place], [place], [address] Phone: [phone]   Fax:  [phone]  Physical Therapy Treatment  Patient Details  Name: Stacy Gallagher MRN: [id number] Date of Birth: 05-04-1960 No Data Recorded  Encounter Date: 02/14/2015      PT End of Session - 02/14/15 1731    Visit Number 3   Number of Visits 9   Date for PT Re-Evaluation 03/08/15   PT Start Time 1430   PT Stop Time 1520   PT Time Calculation (min) 50 min   Activity Tolerance Patient tolerated treatment well   Behavior During Therapy [facility] for tasks assessed/performed      Past Medical History  Diagnosis Date  . Breast cancer of upper-outer quadrant of left female breast 07/03/2014    Past Surgical History  Procedure Laterality Date  . Dilatation & curettage/hysteroscopy with myosure      There were no vitals filed for this visit.  Visit Diagnosis:  Left shoulder pain  Stiffness of shoulder joint, left  Muscle atrophy of upper extremity      Subjective Assessment - 02/14/15 1443    Subjective pt says she went to the gym yesterday for treadmill and stairmaster    Pertinent History Diagnosed with left upper outer breast cancer on 07/02/14.She has no chemotherapy and needs no radiation therapy.  She has a mastectomy with sentinel node resection with DIEP reconstruction on September 12, 2014. She has another sugery for plastic surgery follow up Octobber 26     Patient Stated Goals get left shoulder back to normal and have no pain   Currently in Pain? No/denies  not at rest, but still excurciating when she does certain things                          [facility] Adult PT Treatment/Exercise - 02/14/15 0001    Balance Poses: Yoga   Tree Pose 2 reps;15 seconds;Other (comment)  visual cues in mirror to keep shoulders level    Neck Exercises: Seated   Other Seated Exercise neck range of motion with core engaged.    Lumbar Exercises:  Seated   Hip Flexion on Ball AROM;Both   Hip Flexion on Ball Limitations marching with core stable    Other Seated Lumbar Exercises on ball for ant/post/ and lateral pelvic tilts    Shoulder Exercises: Standing   External Rotation AROM;Strengthening;Both;10 reps;Theraband   Theraband Level (Shoulder External Rotation) Level 1 (Yellow)   Internal Rotation Strengthening;Both;Theraband   Theraband Level (Shoulder Internal Rotation) Level 1 (Yellow)   Flexion Strengthening;Both;Theraband   Theraband Level (Shoulder Flexion) Level 1 (Yellow)   ABduction Strengthening;Both;10 reps   Theraband Level (Shoulder ABduction) Level 1 (Yellow)   Shoulder Exercises: ROM/Strengthening   Over Head Lace pt not able to get arms over head    Ball on Wall yellow ball up wall in flexion and diagonals with hand on wall, then forearm on wall    Other ROM/Strengthening Exercises wall washing  in flexion and diaonals    Manual Therapy   Myofascial Release to trigger points and musclular tightness in left upper back and neck    Scapular Mobilization to left scapula in right sidelying in all planes especially with stretch in inferior glide                       Breast Clinic Goals - 07/11/14 1221    Patient will be able  to verbalize understanding of pertinent lymphedema risk reduction practices relevant to her diagnosis specifically related to skin care.   Time 1   Period Days   Status Achieved   Patient will be able to return demonstrate and/or verbalize understanding of the post-op home exercise program related to regaining shoulder range of motion.   Time 1   Period Days   Status Achieved   Patient will be able to verbalize understanding of the importance of attending the postoperative After Breast Cancer Class for further lymphedema risk reduction education and therapeutic exercise.   Time 1   Period Days   Status Achieved          Long Term Clinic Goals - 02/06/15 1308    CC Long  Term Goal  #1   Title Patient will improve left shoulder abduction to 120 degrees so that she can wash her hair without difficulty  (target 03/07/2015)   Baseline 60   Time 4   Period Weeks   Status New   CC Long Term Goal  #2   Title Patient will report a decrease in pain by 50% so she  can perform daily activities with greater ease (target 03/05/2015)    Time 4   Period Weeks   Status New   CC Long Term Goal  #3   Title Patient will be independent in ahome exercise program ( 03/08/2015)    Time 4   Period Weeks   Status New   CC Long Term Goal  #4   Title Patient will decrease the DASH score to < 50  to demonstrate increased functional use of upper extremity(target 03/08/2015)   Baseline 81.82   Time 4   Period Weeks   Status New            Plan - 02/14/15 1732    Clinical Impression Statement pt appears to making progress though she still reports pain in shoulder with certain movements.  Upgraded exercise today for scapular stretngthening with theraband.    Pt will benefit from skilled therapeutic intervention in order to improve on the following deficits Decreased range of motion;Decreased strength;Impaired perceived functional ability;Impaired UE functional use;Pain;Increased muscle spasms;Decreased activity tolerance;Decreased knowledge of precautions;Decreased knowledge of use of DME   Clinical Impairments Affecting Rehab Potential left mastectomy and DIEP reconstruction    PT Next Visit Plan continue with soft tissue work and left scapular mobilizaion and active range of motion with shoulder range withing pain free range. review supine scap exercise, external rotation and protraction/retraction and rokwood exercise    Consulted and Agree with Plan of Care Patient        Problem List Patient Active Problem List   Diagnosis Date Noted  . Breast cancer of upper-outer quadrant of left female breast ([place]) 07/03/2014   Donato Heinz. Owens Shark, PT  02/14/2015, 5:34 PM  [place], [place], [address] Phone: [phone]   Fax:  [phone]  Name: Stacy Gallagher MRN: [id number] Date of Birth: 09/14/1960

## 2015-02-18 ENCOUNTER — Encounter: Payer: BLUE CROSS/BLUE SHIELD | Admitting: Physical Therapy

## 2015-02-19 ENCOUNTER — Ambulatory Visit: Payer: BLUE CROSS/BLUE SHIELD | Admitting: Physical Therapy

## 2015-02-20 ENCOUNTER — Ambulatory Visit: Payer: BLUE CROSS/BLUE SHIELD

## 2015-02-25 ENCOUNTER — Encounter: Payer: Self-pay | Admitting: Physical Therapy

## 2015-02-25 ENCOUNTER — Ambulatory Visit: Payer: BLUE CROSS/BLUE SHIELD | Admitting: Physical Therapy

## 2015-02-25 DIAGNOSIS — C50412 Malignant neoplasm of upper-outer quadrant of left female breast: Secondary | ICD-10-CM

## 2015-02-25 DIAGNOSIS — M25512 Pain in left shoulder: Secondary | ICD-10-CM

## 2015-02-25 DIAGNOSIS — M25612 Stiffness of left shoulder, not elsewhere classified: Secondary | ICD-10-CM

## 2015-02-25 NOTE — Therapy (Signed)
[place], [place], [address] Phone: [phone]   Fax:  [phone]  Physical Therapy Treatment  Patient Details  Name: Stacy Gallagher MRN: [id number] Date of Birth: 12/19/1960 No Data Recorded  Encounter Date: 02/25/2015      PT End of Session - 02/25/15 1126    Visit Number 4   Number of Visits 9   Date for PT Re-Evaluation 03/08/15   PT Start Time 1100   PT Stop Time 1155   PT Time Calculation (min) 55 min   Activity Tolerance Patient tolerated treatment well   Behavior During Therapy [facility] for tasks assessed/performed      Past Medical History  Diagnosis Date  . Breast cancer of upper-outer quadrant of left female breast ([place]) 07/03/2014    Past Surgical History  Procedure Laterality Date  . Dilatation & curettage/hysteroscopy with myosure      There were no vitals filed for this visit.  Visit Diagnosis:  Left shoulder pain  Stiffness of shoulder joint, left  Carcinoma of upper-outer quadrant of left female breast ([place])      Subjective Assessment - 02/25/15 1106    Subjective No c/o pain.  Just want to improve my ROM.   Pertinent History Diagnosed with left upper outer breast cancer on 07/02/14.She has no chemotherapy and needs no radiation therapy.  She has a mastectomy with sentinel node resection with DIEP reconstruction on September 12, 2014. She has another sugery for plastic surgery follow up Octobber 26     Patient Stated Goals get left shoulder back to normal and have no pain   Currently in Pain? No/denies                         [facility] Adult PT Treatment/Exercise - 02/25/15 0001    Shoulder Exercises: Supine   Other Supine Exercises AAROM bilateral shoulder flexion supine x5 with 5 second holds   Other Supine Exercises Supine cane flexion and abduction x10 each with PT verbal cues for proper technique   Shoulder Exercises: Pulleys   Flexion 2 minutes   ABduction  2 minutes   ABduction Limitations with PT overpressure at upper trap which allowed for increased painfree ROM   Shoulder Exercises: Therapy Ball   Flexion 10 reps   ABduction 10 reps   ABduction Limitations with PT overpressure at upper trap which allowed for increased painfree ROM   Manual Therapy   Manual Therapy Passive ROM   Passive ROM PROM left shoulder all planes to pt tolerance; very limited with abduction and ER                PT Education - 02/25/15 1125    Education provided Yes   Education Details Instructed pt to begin HEP given at pre-op eval to regain ROM; also educated her on [facility] and support group available   Person(s) Educated Patient   Methods Explanation;Demonstration;Handout   Comprehension Verbalized understanding;Returned demonstration              Breast Clinic Goals - 07/11/14 1221    Patient will be able to verbalize understanding of pertinent lymphedema risk reduction practices relevant to her diagnosis specifically related to skin care.   Time 1   Period Days   Status Achieved   Patient will be able to return demonstrate and/or verbalize understanding of the post-op home exercise program related to regaining shoulder range of motion.   Time 1  Period Days   Status Achieved   Patient will be able to verbalize understanding of the importance of attending the postoperative After Breast Cancer Class for further lymphedema risk reduction education and therapeutic exercise.   Time 1   Period Days   Status Achieved          Long Term Clinic Goals - 02/06/15 1308    CC Long Term Goal  #1   Title Patient will improve left shoulder abduction to 120 degrees so that she can wash her hair without difficulty  (target 03/07/2015)   Baseline 60   Time 4   Period Weeks   Status New   CC Long Term Goal  #2   Title Patient will report a decrease in pain by 50% so she  can perform daily activities with greater ease (target 03/05/2015)    Time 4    Period Weeks   Status New   CC Long Term Goal  #3   Title Patient will be independent in ahome exercise program ( 03/08/2015)    Time 4   Period Weeks   Status New   CC Long Term Goal  #4   Title Patient will decrease the DASH score to < 50  to demonstrate increased functional use of upper extremity(target 03/08/2015)   Baseline 81.82   Time 4   Period Weeks   Status New            Plan - 02/25/15 1127    Clinical Impression Statement Patient very limited (about 50%) with her shoulder ROM and appears to have her shoulder joint contributing to the problem and not just tissue tightness from s/p mastectomy and reconstruction.  It appears that the strengthening exercises are causing her to flare up and she will benefit from focusing on ROM and holding off on strengthening.     Pt will benefit from skilled therapeutic intervention in order to improve on the following deficits Decreased range of motion;Decreased strength;Impaired perceived functional ability;Impaired UE functional use;Pain;Increased muscle spasms;Decreased activity tolerance;Decreased knowledge of precautions;Decreased knowledge of use of DME   Clinical Impairments Affecting Rehab Potential left mastectomy and DIEP reconstruction    PT Frequency 2x / week   PT Duration 4 weeks   PT Treatment/Interventions ADLs/Self Care Home Management;Electrical Stimulation;Therapeutic exercise;Therapeutic activities;Patient/family education;DME Instruction   PT Next Visit Plan Focus on ROM and hold off on strengthening.  Very limited with abduction and ER so we can focus on those. Continue conversation about [facility] and support group to educate pt on resources available.   Consulted and Agree with Plan of Care Patient        Problem List Patient Active Problem List   Diagnosis Date Noted  . Breast cancer of upper-outer quadrant of left female breast ([place]) 07/03/2014    Annia Friendly, PT 02/25/2015 12:05 PM   [place], [place], [address] Phone: [phone]   Fax:  [phone]  Name: Stacy Gallagher MRN: [id number] Date of Birth: 1960-05-09

## 2015-02-27 ENCOUNTER — Ambulatory Visit: Payer: BLUE CROSS/BLUE SHIELD

## 2015-02-27 DIAGNOSIS — C50412 Malignant neoplasm of upper-outer quadrant of left female breast: Secondary | ICD-10-CM

## 2015-02-27 DIAGNOSIS — M25612 Stiffness of left shoulder, not elsewhere classified: Secondary | ICD-10-CM

## 2015-02-27 DIAGNOSIS — M25512 Pain in left shoulder: Secondary | ICD-10-CM

## 2015-02-27 NOTE — Therapy (Signed)
[place], [place], [address] Phone: [phone]   Fax:  [phone]  Physical Therapy Treatment  Patient Details  Name: Stacy Gallagher MRN: [id number] Date of Birth: 10/15/1960 No Data Recorded  Encounter Date: 02/27/2015      PT End of Session - 02/27/15 1210    Visit Number 5   Number of Visits 9   Date for PT Re-Evaluation 03/08/15   PT Start Time 1022   PT Stop Time 1108   PT Time Calculation (min) 46 min   Activity Tolerance Patient tolerated treatment well;Patient limited by pain   Behavior During Therapy [facility] for tasks assessed/performed      Past Medical History  Diagnosis Date  . Breast cancer of upper-outer quadrant of left female breast ([place]) 07/03/2014    Past Surgical History  Procedure Laterality Date  . Dilatation & curettage/hysteroscopy with myosure      There were no vitals filed for this visit.  Visit Diagnosis:  Left shoulder pain  Stiffness of shoulder joint, left  Carcinoma of upper-outer quadrant of left female breast [facility])      Subjective Assessment - 02/27/15 1024    Subjective My Lt shoulder is feeling a little better because I've been stretching it some at home.    Currently in Pain? No/denies                         [facility] Adult PT Treatment/Exercise - 02/27/15 0001    Manual Therapy   Myofascial Release To Lt chest wall throughout stretching diagnolly in opposite direction and in axilla   Passive ROM PROM left shoulder all planes to pt tolerance; very limited with abduction and ER                        Long Term Clinic Goals - 02/27/15 1214    CC Long Term Goal  #1   Title Patient will improve left shoulder abduction to 120 degrees so that she can wash her hair without difficulty  (target 03/07/2015)   Status On-going   CC Long Term Goal  #2   Title Patient will report a decrease in pain by 50% so she  can perform daily  activities with greater ease (target 03/05/2015)    Status On-going   CC Long Term Goal  #3   Title Patient will be independent in ahome exercise program ( 03/08/2015)    Status On-going   CC Long Term Goal  #4   Title Patient will decrease the DASH score to < 50  to demonstrate increased functional use of upper extremity(target 03/08/2015)   Status On-going            Plan - 02/27/15 1211    Clinical Impression Statement Focused on manual therapy today only to Lt shoulder and chest/axilla area. Pt tolerated this very well though is limited by pain at her end range abduction. She reports that she felt looser after treatment today.    Pt will benefit from skilled therapeutic intervention in order to improve on the following deficits Decreased range of motion;Decreased strength;Impaired perceived functional ability;Impaired UE functional use;Pain;Increased muscle spasms;Decreased activity tolerance;Decreased knowledge of precautions;Decreased knowledge of use of DME   Rehab Potential Excellent   Clinical Impairments Affecting Rehab Potential left mastectomy and DIEP reconstruction    PT Frequency 2x / week   PT Duration 4 weeks   PT Treatment/Interventions ADLs/Self Care  Home Management;Electrical Stimulation;Therapeutic exercise;Therapeutic activities;Patient/family education;DME Instruction   PT Next Visit Plan Measure AROM next visit for goal assess. Cont to focus on ROM and hold off on strengthening.  Very limited with abduction and ER so we can focus on those. Continue conversation about [facility] and support group to educate pt on resources available.   Consulted and Agree with Plan of Care Patient        Problem List Patient Active Problem List   Diagnosis Date Noted  . Breast cancer of upper-outer quadrant of left female breast ([place]) 07/03/2014    Otelia Limes, PTA 02/27/2015, 12:15 PM  [place], [place], [address] Phone: [phone]   Fax:  [phone]  Name: Stacy Gallagher MRN: [id number] Date of Birth: Aug 17, 1960

## 2015-03-05 ENCOUNTER — Ambulatory Visit: Payer: BLUE CROSS/BLUE SHIELD | Attending: Plastic Surgery | Admitting: Physical Therapy

## 2015-03-05 DIAGNOSIS — M25512 Pain in left shoulder: Secondary | ICD-10-CM

## 2015-03-05 DIAGNOSIS — M6259 Muscle wasting and atrophy, not elsewhere classified, multiple sites: Secondary | ICD-10-CM | POA: Insufficient documentation

## 2015-03-05 DIAGNOSIS — M25612 Stiffness of left shoulder, not elsewhere classified: Secondary | ICD-10-CM | POA: Insufficient documentation

## 2015-03-05 NOTE — Patient Instructions (Signed)
External Rotation (Passive)    Lying on back, place arm out to side with elbow at right angle, using other arm to help. Hold __60 __ seconds. Repeat _3___ times. Do __2__ sessions per day.  Or  Hold a one to 2 pound weight in hand and relax while it is stretching.  Can also do this with arm straight by side with head turned and bent toward right side     Dr. Andreas Blower [facility]    Copyright  VHI. All rights reserved.

## 2015-03-05 NOTE — Therapy (Addendum)
[place], [place], [address] Phone: [phone]   Fax:  [phone]  Physical Therapy Treatment  Patient Details  Name: Stacy Gallagher MRN: [id number] Date of Birth: 07-Dec-1960 No Data Recorded  Encounter Date: 03/05/2015      PT End of Session - 03/05/15 1228    Visit Number 6   Number of Visits 9   Date for PT Re-Evaluation 03/08/15   PT Start Time 1100   PT Stop Time 1145   PT Time Calculation (min) 45 min   Activity Tolerance Patient limited by pain   Behavior During Therapy [facility] for tasks assessed/performed      Past Medical History  Diagnosis Date  . Breast cancer of upper-outer quadrant of left female breast ([place]) 07/03/2014    Past Surgical History  Procedure Laterality Date  . Dilatation & curettage/hysteroscopy with myosure      There were no vitals filed for this visit.  Visit Diagnosis:  Left shoulder pain  Stiffness of shoulder joint, left      Subjective Assessment - 03/05/15 1109    Subjective " I actually can put my hair up now", but it still hurts.    Pertinent History Diagnosed with left upper outer breast cancer on 07/02/14.She has no chemotherapy and needs no radiation therapy.  She has a mastectomy with sentinel node resection with DIEP reconstruction on September 12, 2014. She has another sugery for plastic surgery follow up Octobber 26     Patient Stated Goals get left shoulder back to normal and have no pain   Currently in Pain? Yes   Pain Score 2   at rest, 5 with activity    Pain Orientation Left   Pain Type Chronic pain            [facility] PT Assessment - 03/05/15 0001    AROM   Left Shoulder Flexion 105 Degrees   Left Shoulder ABduction 80 Degrees   Left Shoulder External Rotation 30 Degrees                     [facility] Adult PT Treatment/Exercise - 03/05/15 0001    Shoulder Exercises: Supine   Protraction AROM;Left;10 reps   Flexion AROM   Other  Supine Exercises Supine cane flexion and abduction x10 each with PT verbal cues for proper technique   Shoulder Exercises: Seated   Other Seated Exercises UE ranger in mulitple planes with arm at only about 45 degrees flexion, but pt limited by pain    Shoulder Exercises: Sidelying   External Rotation AROM;Strengthening;Left;15 reps;Weights;Limitations   External Rotation Limitations stopped at pain    Shoulder Exercises: Therapy Ball   Flexion 10 reps   Shoulder Exercises: Stretch   External Rotation Stretch 2 reps;30 seconds;Other (comment)  pt to do this at home with1 or 2 pound weight in hand    Table Stretch - Flexion Limitations;Other (comment)   Table Stretch -Flexion Limitations unable to tolerate due to pain    Manual Therapy   Manual therapy comments contract/relax for shoulder flexion    Joint Mobilization distraction and gentle glides to glenohumeral joint   Scapular Mobilization to left scapula in right sidelying in all planes especially with stretch in inferior glide    Passive ROM PROM left shoulder all planes to pt tolerance; very limited with abduction and ER                PT Education - 03/05/15 1228  Education provided Yes   Education Details continue stretches at home and add external rotation stretch within pain free range    Person(s) Educated Patient   Methods Explanation;Demonstration;Handout   Comprehension Verbalized understanding;Returned demonstration                [facility] Goals - 03/05/15 1231    CC Long Term Goal  #1   Title Patient will improve left shoulder abduction to 120 degrees so that she can wash her hair without difficulty  (target 03/07/2015)   Status Partially Met   CC Long Term Goal  #2   Status Partially Met   CC Long Term Goal  #3   Title Patient will be independent in ahome exercise program ( 03/08/2015)    Status Partially Met   CC Long Term Goal  #4   Title Patient will decrease the DASH score to < 50   to demonstrate increased functional use of upper extremity(target 03/08/2015)   Status On-going            Plan - 03/05/15 1229    Clinical Impression Statement Pt continued to have pain with activity and severely limited range of motion.  She has not been able to make the progress we were hoping for due to pain.  She may need to seek futher consultation from orthopedic or sports medicine specialist and pt is considereing that.  She plans to have more plastic surgery next week  and will be limited in her activit, but will continue her stretches at home.     Clinical Impairments Affecting Rehab Potential left mastectomy and DIEP reconstruction    PT Frequency 2x / week   PT Next Visit Plan  Reassess  and determine if patient will continue stretching    Consulted and Agree with Plan of Care Patient        Problem List Patient Active Problem List   Diagnosis Date Noted  . Breast cancer of upper-outer quadrant of left female breast ([place]) 07/03/2014   Donato Heinz. Owens Shark PT  [place] Levo 03/05/2015, 12:47 PM  [place], [place], [address] Phone: [phone]   Fax:  [phone]  Name: Stacy Gallagher MRN: [id number] Date of Birth: [birth year]    PHYSICAL THERAPY DISCHARGE SUMMARY  Visits from Start of Care: 6  Current functional level related to goals / functional outcomes: See above; patient had partially met goals at her last visit but then no showed for her final visit.   Remaining deficits: Unknown; patient did not return to therapy.   Education / Equipment: Home exercise program  Plan: Patient agrees to discharge.  Patient goals were partially met. Patient is being discharged due to not returning since the last visit.  ?????       Stacy Gallagher, [place] 06/19/2015 4:12 PM

## 2015-03-08 ENCOUNTER — Ambulatory Visit: Payer: BLUE CROSS/BLUE SHIELD | Admitting: Physical Therapy

## 2015-03-08 DIAGNOSIS — M25512 Pain in left shoulder: Secondary | ICD-10-CM | POA: Diagnosis not present

## 2015-03-08 NOTE — Therapy (Signed)
[place], [place], [address] Phone: [phone]   Fax:  [phone]  Physical Therapy Treatment  Patient Details  Name: Stacy Gallagher MRN: [id number] Date of Birth: March 02, 1961 No Data Recorded  Encounter Date: 03/08/2015      PT End of Session - 03/08/15 0940    Visit Number 7   Number of Visits 9   Date for PT Re-Evaluation 03/08/15   PT Start Time 0845   PT Stop Time 0930   PT Time Calculation (min) 45 min   Activity Tolerance Patient tolerated treatment well   Behavior During Therapy [facility] for tasks assessed/performed      Past Medical History  Diagnosis Date  . Breast cancer of upper-outer quadrant of left female breast ([place]) 07/03/2014    Past Surgical History  Procedure Laterality Date  . Dilatation & curettage/hysteroscopy with myosure      There were no vitals filed for this visit.  Visit Diagnosis:  Left shoulder pain  Stiffness of shoulder joint, left  Muscle atrophy of upper extremity      Subjective Assessment - 03/08/15 0851    Subjective pt has been having small gains and feels like she is making progress    Pertinent History Diagnosed with left upper outer breast cancer on 07/02/14.She has no chemotherapy and needs no radiation therapy.  She has a mastectomy with sentinel node resection with DIEP reconstruction on September 12, 2014. She has another sugery for plastic surgery follow up Octobber 26     Patient Stated Goals get left shoulder back to normal and have no pain   Currently in Pain? Yes   Pain Score --  0 at rest , 3-4 with activity             [facility] PT Assessment - 03/08/15 0001    Observation/Other Assessments   Observations improvement in interscapular muscle bulk and symmetry in scapula when raising arms. noted 3 thin visible but not palpable cords at axilla today    AROM   Left Shoulder Flexion 114 Degrees   Left Shoulder ABduction 95 Degrees                      [facility] Adult PT Treatment/Exercise - 03/08/15 0001    Shoulder Exercises: Supine   Other Supine Exercises on soft foam roller for minimarching, alternating arm elevation and opposite arm and leg raises for core activation.as well as abduction stretching    Shoulder Exercises: Stretch   Corner Stretch 3 reps;10 seconds   Manual Therapy   Joint Mobilization distraction and gentle glides to glenohumeral joint   Scapular Mobilization to left scapula in right sidelying in all planes especially with stretch in inferior glide    Passive ROM PROM left shoulder all planes to pt tolerance; very limited with abduction and ER                        [facility] Goals - 03/08/15 0947    CC Long Term Goal  #1   Title Patient will improve left shoulder abduction to 120 degrees so that she can wash her hair without difficulty  (target 03/07/2015)   Status Partially Met   CC Long Term Goal  #2   Title Patient will report a decrease in pain by 50% so she  can perform daily activities with greater ease (target 03/05/2015)    Status Partially Met   CC  Long Term Goal  #3   Title Patient will be independent in ahome exercise program ( 03/08/2015)    Status Achieved   CC Long Term Goal  #4   Title Patient will decrease the DASH score to < 50  to demonstrate increased functional use of upper extremity(target 03/08/2015)   Status On-going            Plan - 03/08/15 0944    Clinical Impression Statement Pt is continuing to make gradual gains and is doing exercises and stretches on her own and at the gym. She still has pain and decreased range of motion and may consider futher MD consulatation in the future.    Pt will benefit from skilled therapeutic intervention in order to improve on the following deficits Decreased range of motion;Decreased strength;Impaired perceived functional ability;Impaired UE functional use;Pain;Increased muscle spasms;Decreased  activity tolerance;Decreased knowledge of precautions;Decreased knowledge of use of DME   Rehab Potential Excellent   Clinical Impairments Affecting Rehab Potential left mastectomy and DIEP reconstruction    PT Next Visit Plan contact pt to see if she wants to continue.  if so, renew, if not, discharge pt.    Consulted and Agree with Plan of Care Patient        Problem List Patient Active Problem List   Diagnosis Date Noted  . Breast cancer of upper-outer quadrant of left female breast ([place]) 07/03/2014   Donato Heinz. Owens Shark, PT  03/08/2015, 9:48 AM  [place], [place], [address] Phone: [phone]   Fax:  [phone]  Name: Treina Arscott MRN: [id number] Date of Birth: [birth year]

## 2015-05-28 ENCOUNTER — Telehealth: Payer: Self-pay | Admitting: *Deleted

## 2015-05-28 NOTE — Telephone Encounter (Signed)
This RN spoke with pt per call requesting to speak to this RN.  Pt states she was seen in [facility] in April of 2016- but transferred care to [facility] due to " the particular surgery I wanted needed to be done there ".  Pt completed mastectomy in June of 2016 and is now following up with plastic surgeon in [place].  Pt would like to return to this office for follow care for breast cancer and is requesting to see Dr Jana Hakim.  Return call number given for pt verified as [phone].

## 2015-06-03 NOTE — Telephone Encounter (Signed)
That's certainly fine with me. Thanks.   Stacy Gallagher

## 2015-06-07 ENCOUNTER — Other Ambulatory Visit: Payer: Self-pay | Admitting: *Deleted

## 2015-06-09 ENCOUNTER — Other Ambulatory Visit: Payer: Self-pay | Admitting: Oncology

## 2015-06-09 NOTE — Telephone Encounter (Signed)
enteredt POF for April appt  GM

## 2015-06-10 ENCOUNTER — Telehealth: Payer: Self-pay | Admitting: Oncology

## 2015-06-10 NOTE — Telephone Encounter (Signed)
Called patient to give np appt w/Dr. Jana Hakim per patient will return call.

## 2015-06-11 ENCOUNTER — Telehealth: Payer: Self-pay | Admitting: Oncology

## 2015-06-11 NOTE — Telephone Encounter (Signed)
lvm for pt regarding to April appt.... °

## 2015-07-15 ENCOUNTER — Other Ambulatory Visit: Payer: Self-pay

## 2015-07-15 DIAGNOSIS — C50412 Malignant neoplasm of upper-outer quadrant of left female breast: Secondary | ICD-10-CM

## 2015-07-16 ENCOUNTER — Encounter: Payer: Self-pay | Admitting: Oncology

## 2015-07-16 ENCOUNTER — Other Ambulatory Visit (HOSPITAL_BASED_OUTPATIENT_CLINIC_OR_DEPARTMENT_OTHER): Payer: BLUE CROSS/BLUE SHIELD

## 2015-07-16 ENCOUNTER — Ambulatory Visit (HOSPITAL_BASED_OUTPATIENT_CLINIC_OR_DEPARTMENT_OTHER): Payer: BLUE CROSS/BLUE SHIELD | Admitting: Oncology

## 2015-07-16 VITALS — BP 139/61 | HR 62 | Temp 98.1°F | Resp 18 | Ht 64.5 in | Wt 127.3 lb

## 2015-07-16 DIAGNOSIS — N289 Disorder of kidney and ureter, unspecified: Secondary | ICD-10-CM | POA: Diagnosis not present

## 2015-07-16 DIAGNOSIS — K769 Liver disease, unspecified: Secondary | ICD-10-CM

## 2015-07-16 DIAGNOSIS — Z800 Family history of malignant neoplasm of digestive organs: Secondary | ICD-10-CM

## 2015-07-16 DIAGNOSIS — C50412 Malignant neoplasm of upper-outer quadrant of left female breast: Secondary | ICD-10-CM

## 2015-07-16 DIAGNOSIS — Z808 Family history of malignant neoplasm of other organs or systems: Secondary | ICD-10-CM

## 2015-07-16 DIAGNOSIS — Z809 Family history of malignant neoplasm, unspecified: Secondary | ICD-10-CM

## 2015-07-16 LAB — CBC WITH DIFFERENTIAL/PLATELET
BASO%: 0.8 % (ref 0.0–2.0)
Basophils Absolute: 0 10*3/uL (ref 0.0–0.1)
EOS%: 3.7 % (ref 0.0–7.0)
Eosinophils Absolute: 0.2 10*3/uL (ref 0.0–0.5)
HCT: 39.3 % (ref 34.8–46.6)
HEMOGLOBIN: 12.9 g/dL (ref 11.6–15.9)
LYMPH%: 35.1 % (ref 14.0–49.7)
MCH: 28.9 pg (ref 25.1–34.0)
MCHC: 32.7 g/dL (ref 31.5–36.0)
MCV: 88.3 fL (ref 79.5–101.0)
MONO#: 0.4 10*3/uL (ref 0.1–0.9)
MONO%: 6.9 % (ref 0.0–14.0)
NEUT%: 53.5 % (ref 38.4–76.8)
NEUTROS ABS: 3.2 10*3/uL (ref 1.5–6.5)
PLATELETS: 208 10*3/uL (ref 145–400)
RBC: 4.45 10*6/uL (ref 3.70–5.45)
RDW: 13.2 % (ref 11.2–14.5)
WBC: 5.9 10*3/uL (ref 3.9–10.3)
lymph#: 2.1 10*3/uL (ref 0.9–3.3)

## 2015-07-16 LAB — COMPREHENSIVE METABOLIC PANEL
ALT: 12 U/L (ref 0–55)
ANION GAP: 6 meq/L (ref 3–11)
AST: 21 U/L (ref 5–34)
Albumin: 3.7 g/dL (ref 3.5–5.0)
Alkaline Phosphatase: 46 U/L (ref 40–150)
BUN: 11.5 mg/dL (ref 7.0–26.0)
CO2: 28 mEq/L (ref 22–29)
CREATININE: 0.8 mg/dL (ref 0.6–1.1)
Calcium: 9.3 mg/dL (ref 8.4–10.4)
Chloride: 106 mEq/L (ref 98–109)
EGFR: 82 mL/min/{1.73_m2} — ABNORMAL LOW (ref >90)
GLUCOSE: 94 mg/dL (ref 70–140)
Potassium: 3.7 mEq/L (ref 3.5–5.1)
Sodium: 140 mEq/L (ref 136–145)
TOTAL PROTEIN: 6.8 g/dL (ref 6.4–8.3)
Total Bilirubin: 0.36 mg/dL (ref 0.20–1.20)

## 2015-07-16 NOTE — Progress Notes (Signed)
[place]  [phone] Fax:(336) [place] Note   Patient Care Team: Louretta Shorten, MD as PCP - General (Obstetrics and Gynecology) Louretta Shorten, MD as Consulting Physician (Obstetrics and Gynecology) Autumn Messing III, MD as Consulting Physician (General Surgery) Truitt Merle, MD as Consulting Physician (Hematology) Thea Silversmith, MD as Consulting Physician (Radiation Oncology) Rockwell Germany, RN as Registered Nurse Mauro Kaufmann, RN as Registered Nurse Holley Bouche, NP as Nurse Practitioner (Nurse Practitioner) 07/16/2015  CHIEF COMPLAINT: Estrogen receptor positive breast cancer  CURRENT TREATMENT: Considering systemic therapy     Breast cancer of upper-outer quadrant of left female breast ([place])   06/26/2014 Breast US A 1 x 0.9 x 0.8 cm irregular hypoechoic mass at the 4 o'clock position 4 cm from the nipple.Suspicious masses at the 12:30 position, 1 o'clock position and 4 o'clock position of the left breast - tissue sampling of thesemasses recommended.    06/26/2014 Breast US A 1.2 x 1 x 1.3 cm suspicious irregular hypoechoic area/mass at the 12:30 position 2 cm from the nipple, likely representing the architectural distortion identified mammographically. A 0.8 x 0.8 x 0.9 cm irregular hypoechoic mass at the 1 o'clock    07/02/2014 Initial Biopsy 1. Breast, left, needle core biopsy, 1, 12:30 and 4  o'clock position - INVASIVE MAMMARY CARCINOMA. - MAMMARY CARCINOMA IN SITU.    07/02/2014 Receptors her2 Estrogen Receptor: 100%, POSITIVE, STRONG STAINING INTENSITY Progesterone Receptor: 98%, POSITIVE, STRONG STAINING INTENSITY Proliferation Marker Ki67: 10%, HER-2/NEU BY CISH - NEGATIVE.   07/03/2014 Initial Diagnosis Breast cancer of upper-outer quadrant of left female breast   07/10/2014 Breast MRI 1. Biopsy marker clip artifact in the 12:30 o'clock position of the left breast at the location of biopsy-proven invasive mammary carcinoma without a  separate visible enhancing mass at this time.    07/10/2014 Breast MRI 2. 1.3 cm biopsy-proven invasive mammary carcinoma in the midportion of the upper-outer quadrant of the left breast. 3. 1.3 cm biopsy-proven invasive mammary carcinoma in the posterior aspect of the lower outer quadrant of the left breast.    07/10/2014 Breast MRI 4. 0.8 cm rounded, mildly irregular enhancing mass in the posterior aspect of the upper-outer quadrant of the left breast, 0.8 cm similar-appearing mass in the posterior aspect of the lower outer quadrant of the left breast and adjacent 0.8 cm similar-   07/10/2014 Breast MRI 1.4 cm mildly irregular oval, enhancing mass in the anterior aspect of the lower inner quadrant of the right breast. This is suspicious for the possibility of malignancy.    HISTORY OF PRESENTING ILLNESS:  From Dr. [name] 07/11/2014 intake note:  "Stacy Gallagher 55 y.o. female is here because of newly diagnosed breast cancer  This was discovered by screening mammogram. Her last mammogram was 2 years ago which was normal. Her diagnostic mammogram showed 3 lesions in the left breast, measuring up to 1.3 cm, with the largest distance about 5.3 cm. 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 Ki-67 6-11%. [name] on 07/10/2014 showed 3 additional small lesions in the left breast and a 1.4 cm mass in the right breast, she is scheduled to have a right breast mass biopsy on April 18.   She denies any palpable mass in the breast or axilla before the screening. She feels very well overall, denies any pain, fatigue, change of her appetite or weight  loss lately. She denies any other symptoms."  Her subsequent history is as detailed below  INTERVAL HISTORY: Zynia was evaluated in the breast clinic 07/16/2015. She is establishing herself in my service today.  To bring our records  up-to-date: After meeting with plastic surgery here and in [place] was evaluated at [facility] and underwent DIEP reconstruction under Dr Grier Mitts in June 2016. She did well with the surgery, has had some "touch up" fat grafting's, and had a right reduction mammoplasty in October. In general she is pleased with the cosmetic results and tells me she is not planning any further surgery at this point.  Prior to the surgery and reconstruction she had a CT angiogram of the abdomen which was used for surgical planning. However it did show a 0.9 cm left renal mass suspicious for renal carcinoma. There were also 2 nonspecific liver lesions. Zuriyah apparently was not aware of these results.   In addition I was surprised that she is not on some kind of systemic therapy. I am noting in the 07/11/2014 note from Dr. Burr Medico my partner that the patient was told an Oncotype test would be recommended to help with the chemotherapy decision and that she should receive endocrine therapy with tamoxifen. However she has not been seen by medical oncology since that initial visit. Valynn tells me that she was called for a follow-up visit but too many things were going on and apparently she failed to show   REVIEW OF SYSTEMS Aside from mild discomfort associated with the left reconstructed breast, a detailed review of systems today was noncontributory  MEDICAL HISTORY:  Past Medical History  Diagnosis Date  . Breast cancer of upper-outer quadrant of left female breast ([place]) 07/03/2014  . S/P mastectomy left    SURGICAL HISTORY: Past Surgical History  Procedure Laterality Date  . 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 She is divorced. At home it's she and her dad [name] and the patient's younger son [name], currently 23 years old.  The patient's 22 year old son [name] owns his own business. History   Social History  . Marital Status: Divorced    Spouse Name: N/A  . Number of Children: 2, age of 53 and 48 as of April 2017   . Years of Education: N/A   Occupational History  .  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 Smoking status: Never Smoker   . Smokeless tobacco: Not on file  . Alcohol Use: No  . Drug Use: No  . Sexual Activity: Not on file   Other Topics Concern  . Not on file   Social History Narrative    FAMILY HISTORY: Family History  Problem Relation Age of Onset  . Colon cancer Maternal Aunt   . Cancer Maternal Aunt 65    colon cancer   . Throat cancer Maternal Grandmother   . Cancer Maternal Grandmother   The patient's father is alive, at age 40. The patient's mother died at the age of 21 from noncancer related causes. The patient has 2 brothers, 1 sister. There is no history of breast or ovarian cancer in the family.  ALLERGIES:  has No Known Allergies.  MEDICATIONS:  No outpatient encounter prescriptions on file as of 07/16/2015.  No facility-administered encounter medications on file as of 07/16/2015.   PHYSICAL EXAMINATION: ECOG PERFORMANCE STATUS: 0 - Asymptomatic  Filed Vitals:   07/16/15 1557  BP: 139/61  Pulse: 62  Temp: 98.1 F (36.7 C)  Resp: 18   Filed Weights   07/16/15 1557  Weight: 127 lb 4.8 oz (57.743 kg)    Sclerae unicteric, pupils round and equal Oropharynx clear and moist-- no thrush or other lesions No cervical or supraclavicular adenopathy Lungs no rales or rhonchi Heart regular rate and rhythm Abd soft, nontender, positive bowel sounds MSK no focal spinal tenderness, no upper extremity lymphedema Neuro: nonfocal, well oriented, appropriate affect Breasts: The right breast is status post reduction mammoplasty. The left breast is status post nipple sparing mastectomy with DIEP reconstruction. There is no  evidence of local recurrence. Both axillae are benign.   LABORATORY DATA:   Lab Results  Component Value Date   WBC 5.9 07/16/2015   HGB 12.9 07/16/2015   HCT 39.3 07/16/2015   MCV 88.3 07/16/2015   PLT 208 07/16/2015    Recent Labs  07/16/15 1518  NA 140  K 3.7  CO2 28  GLUCOSE 94  BUN 11.5  CREATININE 0.8  CALCIUM 9.3  PROT 6.8  ALBUMIN 3.7  AST 21  ALT 12  ALKPHOS 46  BILITOT 0.36   Pathology results  FINAL PATHOLOGIC DIAGNOSIS MICROSCOPIC EXAMINATION AND DIAGNOSIS  SKIN, "RIGHT BREAST TISSUE", RECONSTRUCTION:      Benign skin and breast tissue with no diagnostic abnormality.   Report Prepared By:  Mickey Farber. Eulah Citizen, M.D., Resident-Pathology  I have personally reviewed the slides and/or other related materials referenced, and have edited the report as part of my pathologic assessment and final interpretation.  Electronically Signed Out By:   Raliegh Ip. Stogner-Underwood, M.D., Pathology   01/21/2015 13:35:52  ks/saw  Specimen(s) Received  Right breast tissue   Clinical History Status post breast reconstruction.     Gross Description Received labeled "right breast tissue" is a 5 g, 4.4 x 1.7 x 0.9 cm tan skin ellipse. The specimen is serially sectioned to reveal a tan yellow fibroadipose cut surface. No lesions or abnormalities are grossly identified. Representative sections are submitted in A1-2.  Abundio Miu, M.D.,   ACCESSION NUMBER:  [id number] RECEIVED: 09/12/2014 ORDERING PHYSICIAN:  Angelina Ok , MD PATIENT NAME:  Stacy Gallagher SURGICAL PATHOLOGY REPORT  FINAL PATHOLOGIC DIAGNOSIS   MICROSCOPIC EXAMINATION AND DIAGNOSIS  A.  LEFT SENTINEL LYMPH NODE #2, EXCISION:      No malignancy identified in one lymph node (0/1).  B.  LEFT SENTINEL LYMPH NODE #1, EXCISION:      No malignancy identified in one lymph node (0/1).  C.  BREAST, LEFT, MASTECTOMY:      Invasive mammary carcinoma with ductal and lobular features, [place]  grade II.      Multifocal, greatest dimension of largest focus of tumor 2.1 cm.      Scattered foci of carcinoma in situ, intermediate nuclear grade.      No lymphovascular invasion identified.      Microcalcifications present in invasive carcinoma, carcinoma in situ, and non-neoplastic tissue.      Tumor comes to less than 1 millimeter from deep inked margin.      All margins negative for malignancy.      pT2, pN0 (sn), pMX.      See protocol below.      Please see comment.  COMMENT: The tumor morphology is predominantly lobular, with focal areas of nesting  suggestive of ductal features.  Immunohistochemical stains for E-cadherin, performed on two sites representing the spectrum of morphology, show negative staining in the invasive and in-situ components in both areas.  The findings favor invasive lobular carcinoma arising in a background of lobular carcinoma in situ, however focal ductal features cannot be entirely excluded.  The specimen reveals multiple lesions with invasive mammary carcinoma at the 12:00 (2.1 cm), 3:00 (1.3 cm), and 4:00 (1.7 cm) positions.  In addition, a focus of invasive mammary carcinoma is identified microscopically in a random section from the lower outer quadrant (0.5 cm as measured on the glass slide).  Per the outside report, hormone receptor studies were previously performed on biopsies of three lesions, showing that all were positive for ER and PR, and negative for Her2/Neu (0).  Hormone receptor studies have been performed on the lesion identified at the 3:00 position in the present specimen, showing positive staining for ER (60%), positive PR (80%), and negative staining for Her2/Neu (0).  Representative tumor is present in blocks C2 (12:00 lesion), C8 (3:00 lesion), C11 (4:00 lesion), and C15 (incidental focus in lower outer quadrant).  Immunohistochemical controls worked appropriately.  "These tests were developed and their performance  characteristics determined by [facility], [facility].  They have not been cleared or approved by the U.S. Food and Drug Administration.  The FDA has determined that such clearance or approval is not necessary.  These tests are used for clinical purposes.  They should not be regarded as investigational or for research.  This laboratory is certified under the [place] (CLIA) as qualified to perform high complexity clinical laboratory testing."        INVASIVE CARCINOMA OF THE BREAST - COMPLETE EXCISION AND                     MASTECTOMY - CANCER PROTOCOL  SPECIMEN:      Total breast excision (including nipple and skin) PROCEDURE:      Total mastectomy (including nipple and skin) LYMPH NODE SAMPLING:      Sentinel lymph node(s) SPECIMEN INTEGRITY:      Single intact specimen (margins can be evaluated) SPECIMEN LATERALITY:      Left TUMOR SITE: INVASIVE CARCINOMA:      Upper outer quadrant      Lower outer quadrant      Upper inner quadrant TUMOR SIZE (LARGEST INVASIVE CARCINOMA):      Greatest dimension of largest focus of invasion over 0.1 cm: 2.1 cm TUMOR FOCALITY:      Multiple foci of invasive carcinoma         Number of foci: 4 MACROSCOPIC AND MICROSCOPIC    EXTENT OF TUMOR -        Skin:      Invasive carcinoma does not invade into the dermis or epidermis        Skeletal Muscle:      No skeletal muscle present HISTOLOGIC TYPE OF INVASIVE CARCINOMA:      Invasive carcinoma with ductal and lobular features ("mixed type carcinoma") HISTOLOGIC GRADE: NOTTINGHAM SCORE:      Glandular (Acinar)/Tubular Differentiation:      Score 3: <10% of tumor area forming glandular/tubular structures      Nuclear Pleomorphism:      Score 2: Cells larger than normal with open vesicular nuclei, visible nucleoli, and moderate variability in both size and shape      Mitotic Count:  Score 1      Overall  Grade:      Grade 2: scores of 6 or 7 DUCTAL CARCINOMA IN SITU (DCIS):      No DCIS is present LOBULAR CARCINOMA IN SITU (LCIS):      Present MARGINS:      Margins uninvolved by invasive carcinoma         Distance from closest margin: less than 0.1 cm         Margin(s): Deep LYMPH-VASCULAR INVASION:      Not identified DERMAL LYMPH-VASCULAR INVASION:      Not identified LYMPH NODES:      Number of sentinel nodes examined: 2      Total number of nodes examined (sentinel and nonsentinel): 2      Number of lymph nodes with macrometastases (>0.2 cm):  0      Number of lymph nodes with micrometastases (>0.2 mm to 0.2 cm and/or >200 cells):  0      Number of lymph nodes with isolated tumor cells (<0.2 mm and =200 cells):  0      Method of Evaluation of Sentinel Lymph Nodes:      H and E, multiple levels  PATHOLOGIC STAGING   (pTNM) PRIMARY TUMOR (pT):      pT2 REGIONAL LYMPH NODES (pN):      sn, pN0 DISTANT METASTASIS (pM):      pMX ANCILLARY STUDIES      Estrogen Receptor:      Performed on this specimen      Immunoreactive tumor cells present (> or= 1%)         Quantitation:  60%      Progesterone Receptor:      Performed on this specimen      Immunoreactive tumor cells present (> or = 1%)         Quantitation:  80%      HER2/neu:      Performed on this specimen      Negative (Score 0)      Fluorescence In Situ Hybridization          (FISH) for HER2/neu:      Name of Assay:  n/a MICROCALCIFICATIONS:      Present in invasive carcinoma      Present in non-neoplastic tissue      Present in both carcinoma and non-neoplastic tissue ADDITIONAL PATHOLOGIC FINDINGS:      Sclerosing adenosis, fibroadenoma. --------------------------------------------------------      Report Prepared By:  Mickey Farber. Eulah Citizen, M.D., Resident-Pathology  I have personally reviewed the slides and/or other related materials referenced, and have edited the report as part of my pathologic assessment and final  interpretation.  Electronically Signed Out By:   Margaret Pyle, M.D., Pathology 09/19/2014 12:46:33  gep/saw  Specimen(s) Received A:  Sentinel lymph node #2, axilla  B:  Sentinel lymph node #1, axilla  C:  Left breast mastectomy   Clinical History Breast cancer of the upper outer quadrant of left female breast.  Intraoperative Procedure TPA1  "LEFT AXILLARY SENTINEL NODE #2"      Negative for malignancy.  TPB1  "LEFT AXILLARY SENTINEL NODE #1"      Negative for malignancy.  Touch prep diagnoses rendered by Dr. Claybon Jabs on June 15, 2016Jerline Pain, M.D., Pathology   Gross Description A.  Received labeled "left Sentinel lymph node 2" is a 1.0 x 0.6 x 0.4 cm tan-red soft tissue fragment. The specimen is bisected and intraoperative touch  prep are performed. The specimen is entirely submitted in A1.  B.  Received labeled "Sentinel lymph node 1" is a 1.5 x 1.2 x 0.6 cm tan-pink soft tissue fragment. The specimen is bisected and intraoperative touch preps are performed. The specimen is entirely submitted in B1.  C.  Received labeled "left breast mastectomy" is a 417 g, left simple mastectomy received oriented per the surgeon with a short stitch on the superior aspect and a long stitch on the lateral aspect.  The specimen measures 18.9 x 21.3 x 2.7 cm and has a 3.1 x 2.3 cm tan-pink skin ellipse. The areola is 2.4 x 2.2 cm and the nipple is 1.2 x 0.8 x 0.4 cm. The skin is tan-pink and intact. The superficial margin is inked blue and the deep margin is inked black.  The specimen is sectioned from medial to lateral to reveal multiple tan-white lesions. Lesion #1 is 2.1 x 1.5 x 0.5 cm, in the 12:00 position, approximately 0.8 cm from the deep margin, 0.6 cm from the superficial margin and 4.1 cm from the nipple. Lesion #2 is a 0.6 x 0.5 x 0.4 cm tan-white circumscribed lesion at approximately the 1:00 position, 1.7 cm from lesion #1, 1.2 cm from the deep margin and 0.7 cm  from the superficial margin, and approximately 5.5 cm from the nipple. Lesion #3 is a 1.3 x 0.8 x 0.7 cm ill circumscribed tan-white lesion at the 3:00 position, 0.1 cm from the deep margin, 1.1 cm from the superficial margin and approximately 10.7 cm from the nipple. Lesion #4 is a 1.7 x 1.4 x 0.7 cm well circumscribed tan-white lesion at approximately the 4:00 position, 0.3 cm from the superficial margin, 1.1 cm from the deep margin and 6.4 cm from the nipple. Lesion #5 is 0.7 x 0.6 x 0.3 cm and tan-white in the approximately 3:00 position, 1.0 cm from the superficial margin, 1.6 cm from the deep margin and approximately 4.5 cm from the nipple. 2 additional nodules measuring 0.2 and 0.1 cm in greatest dimension are located in the lower outer quadrant, as close as 0.1 cm from the deep margin. 2 additional nodules are located in the lower inner quadrant measuring 0.1 and 0.2 cm in greatest dimension. The remainder of the cut surface consists of 50 % tan white fibrous tissue and 50 % adipose. No additional abnormalities are noted.  A representative section of lesion #4 is submitted for tumor bank. Representative sections are submitted as follows:  BLOCK SUMMARY: C1          section of skin, nipple, and areola; C2-4          lesion #1 to include superficial and deep margin C5-6          lesion #2 C7-8          lesion #3 C9-11          lesion #4 C12-13          lesion #5 C14          nodules in lower outer quadrant C15          unaffected lower outer quadrant C16          nodules in lower inner quadrant C17          unaffected lower inner quadrant C18          unaffected upper inner quadrant C19          unaffected upper outer quadrant  Please note the  specimen is the vascularized at 1038 and placed in formalin 1135.   Abundio Miu, M.D.,     [place]             Date Ordered: 09/18/2014        Date Reported: 09/18/2014 Interpretation                        Prognostic Markers in Cancer  Clinical Information  Diagnosis: Invasive ductal carcinoma Specimen Number: #[id number]         Assay                        Results  Estrogen Receptors               POSITIVE (60%)               Intensity=Intermediate  Progesterone Receptors           POSITIVE (80%                  Intensity=Strong   Her2                 Score: NEGATIVE (0)   References:  1. Phylliss Bob, [place] of Clinical Oncology/College of American Pathologists Guideline Recommendations for Immunohistochemical Testing of Estrogen and Progesterone Receptors in Breast Cancer. Archives of Pathology and Laboratory Medicine 481:85-63, 2010. 2. Yves Dill, [facility] et al. American Society of Clinical Oncology/College of American Pathologists Guideline Recommendations for Human Epidermal Growth Factor Receptor 2 testing in Breast Cancer. Archives of Pathology and Laboratory Medicine 131: 18-43, 2007.  COMMENTS ON RESULTS  Fixation time was in compliance with CAP/ASCO guidelines.  The positive and negative immunohistochemical controls stained appropriately.   Interpretation Interpretation of the results is semi-quantitative and reported as percent of tumor cells staining positive and staining intensity is reported as weak, intermediate or strong.    Estrogen Receptor (Clone 6F11-[employer])- A positive test is defined as positive staining of greater than or equal to 1%of tumor cells. A negative test is defined as staining of less than 1% of tumor cells.  Progesterone Receptor (Clone 16-[employer])- A positive test is defined as positive staining of greater than or equal to 1% of tumor cells. A negative test is defined as staining of less than 1% of tumor cells.  Her2 (c-erbB-2-[employer] Herceptest)- A membrane specific marker where the staining intensity criteria of invasive tumor are scored from 0 through 3+. 0 = No staining is observed  or membrane staining is observed in less than 10% of the tumor cells (clinically negative), 1+ = A faint/barely perceptible membrane staining is detected in more than 10% of the tumor cells.   The cells are only stained in part of their membrane (clinically negative), 2+ =A weak to moderate complete membrane staining is observed in more than 10% of the tumor cells (Weakly positive) (further analysis by FISH recommended), 3+ = A  strong complete membrane staining is observed in more than 10% of the tumor cells (Strongly positive). Based on clinical trial, patients with positively stained tumors are candidates for Herceptin treatment.  If only in situ (non-invasive) tumor is present, a score is reported as DCIS or LCIS ONLY.  Methodology All of these markers are immunocytochemical and unless otherwise noted, were performed on formalin-fixed, paraffin embedded tissues.  The detection system used for ER, PR and MIB-1 is the [employer] Polymer Refine Detection system utilizing  their epitope retrieval solution 1 for 20 minutes. Interpretation of the results is semi-quantitative and reported as percent of tumor cells staining positive and staining intensity is reported as weak, intermediate or strong. Her2 (c-erb-2) is performed using the [employer] Herceptest and staining is reported as scored by the Herceptest  DAKO scoring criteria.   CLIA ID No. [id number] CAP LAB. No. [id number] Steroid Receptor Lab [facility]: [phone]  "These tests were developed and their performance characteristics determined by [facility], [place].  They have not been cleared or approved by the U.S. Food and Drug Administration.  The FDA has determined that such clearance or approval is not necessary.  These tests are used for clinical purposes.  They should not be regarded as investigational or for research.  This laboratory is certified under  the [place] (CLIA) as qualified to perform high complexity clinical laboratory testing."   BRD/brd  I have personally reviewed the slides and/or other related materials referenced, and have edited the report as part of my pathologic assessment and final interpretation.  Electronically Signed Out By:   Areta Haber , MD 09/18/2014 14:37:53    RADIOGRAPHIC STUDIES: CTA ABDOMEN PELVIS [place] CONTRAST (AORTA)08/29/2014  [facility]  CTA ABDOMEN PELVIS [place] (AORTA)08/29/2014  [facility]  Result Impression    1. 9 mm arterially enhancing lesion arising in the interpolar region of the left kidney with imaging features concerning for renal cell carcinoma ( clear-cell type). The differential diagnoses include oncocytoma. 2. Bilateral kidneys demonstrate multiple less than 5 mm low attenuating lesions which are too small to characterize. 3. Approximately 1 cm arterially enhancing lesion arising in segment 6 of the liver. This lesion likely represent an area of perfusion anomaly or an FNH. Given the history of other primary malignancy, an MRI of the liver is recommended to definitively characterize this lesion. 4. 9 mm low attenuating lesion in segment 6 of the liver is also indeterminate on this study. 5. 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 If there is clinical history of vaginal bleeding, a pelvic ultrasound is recommended to better evaluate these lesions. Reformatted images for DIEP planning were created and sent to PACS.   Result Narrative  CT ANGIOGRAM OF THE ABDOMEN AND PELVIS, Aug 29, 2014 09:24:29 AM . INDICATION:  Abdominal Perforator Mapping for DIEP Flap Breast ReconstructionC50.412 Breast c  . COMPARISON: None. . TECHNIQUE: Axial images of the abdomen and pelvis were obtained without intravenous contrast. Using bolus tracking, intravenous contrast was administered and  axial images of the abdomen and pelvis were obtained in the arterial and venous phases. Coronal maximal intensity projection images were created for comprehensive analysis and diagnosis of the regional circulation. Marland Kitchen FINDINGS: . LOWER CHEST: .  Mediastinum/Hila: Within normal limits. .  Heart/vessels: Within normal limits. .  Pleura: Within normal limits. .  Lungs: Mild dependent atelectasis in bilateral lower lobe. No pulmonary nodules.. . ABDOMEN: .  Liver: 9 mm low attenuating lesion arising in segment 6 of the liver is too small to characterize. In addition there is an ill-defined hyperenhancing lesion measuring approximately 1 cm in segment 6 (best seen on series 3 image 81) .  Gallbladder/biliary: Within normal limits. .  Spleen: Within normal limits. .  Pancreas: Within normal limits. .  Adrenals: Within normal limits. .  Kidneys:  9 mm arterially enhancing lesion arising from the interpolar region of the left kidney from  the posterior cortex which demonstrate washout of contrast on nephrographic phase (best seen on series 5 image 55). Multiple tiny (less than 5 mm) low attenuating lesions in bilateral kidneys are too small to characterize. No evidence of hydronephrosis or nephrolithiasis. .  Peritoneum/mesenteries: Within normal limits. .  Extraperitoneum: Within normal limits. .  Gastrointestinal tract: Stomach, small bowel and large bowel loops do not show evidence of abnormal bowel wall thickening or abnormal dilatation. A tiny appendicolith is seen within the appendix. However appendix is of normal caliber without evidence of periappendiceal inflammation. Marland Kitchen PELVIS: .  Peritoneum: Within normal limits. .  Extraperitoneum: Within normal limits. .  Ureters: Within normal limits. .  Bladder: Within normal limits. .  Reproductive System: Incidental note is made of a retroverted uterus. Enhancing 1.8 cm lesion arising from the right uterine wall consistent with a leiomyoma. An  enhancing 1.5 cm lesion arising from the fundus of the uterus and projecting into the endometrial canal, likely representing a submucosal leiomyoma. No adnexal masses. Marland Kitchen VASCULAR: .  Aorta: Within normal limits. .  Celiac trunk and branches: Within normal limits. .  Superior mesenteric trunk and branches: Within normal limits. .  Inferior mesenteric trunk and branches: Within normal limits. .  Renal arteries: Within normal limits. .  Iliac arteries: Within normal limits. .  Venous circulation: Within normal limits. . MSK: Mild degenerative changes seen within the spine. There are no lytic or sclerotic lesion suspicious for neoplastic process.. .   Status    ASSESSMENT:  (1) 55 y.o.  [place] woman status post left breast biopsy 3 06/29/2014 for identical invasive mammary carcinomas with lobular features, estrogen receptor and progesterone receptor both strongly positive, MIB-1 in the 6-11% range, with HER-2 not amplified ([id number])  (a) biopsy of an inner lower quadrant right breast lesion was benign and concordant, 07/16/2014  (2) status post left mastectomy and sentinel lymph node sampling 09/12/2014 for an mpT2 pN0, stage IIA  invasive lobular breast cancer (E-cadherin negative, but with some ductal features), grade 2, again estrogen and progesterone receptor positive and HER-2 negative ([id number] at [facility])  (a) deep margin was less than a millimeter, but negative  (b) a total of 2 sentinel lymph nodes were removed  (c) immediate DIEP reconstruction  (d) status post right mastopexy with benign pathology 01/16/2015  (3) adjuvant therapy:  The patient is considering tamoxifen, discussed 07/11/2014 and 07/16/2015  (4) CT angiogram of the abdomen and pelvis on 09/18/2014 shows a 0.9 cm left renal lesion suspicious for renal cell carcinoma, and 2 indeterminate hepatic lesions measuring 1.0 and 0.9 cm.  PLAN: I spent approximately one hour today with Jonique going over her  situation.  She has had excellent local treatment and her reconstruction is cosmetically very favorable. However she has not received any systemic therapy as of yet.  We discussed the difference between local and systemic treatments. She understands that there is some risk of her breast cancer having spread outside the breast before it was removed. She could have microscopic tumor deposits in her bones liver or lungs which would not be detectable by any radiologic or other means. I quoted her a risk of 24% of that being the case.  That is why we recommend systemic therapy. The 3 choices there are anti-estrogens, anti-HER-2 immunotherapy, and chemotherapy. She is not a candidate for anti-HER-2 treatments. She may have been a candidate for chemotherapy --normally we would have requested an Oncotype from the definitive surgery specimen do guide that decision --  but at this point she is too far out from local treatment to consider chemotherapy. I have no data that chemotherapy at this point would be helpful.   Accordingly her only choice for systemic therapy  would be anti-estrogens. Since she still premenopausal , that would mean tamoxifen. We discussed the possible toxicities, side effects and complications of tamoxifen and she understands if she takes it for 10 years if can cut her risk of recurrence in half.   At this point Blaike is not sure that she wants to "put any foreign materials in my body."  I asked her to read up further on tamoxifen so that we can discuss it on her return visit.   In the meantime we need to clear up the issues brought up by her CT angiogram of June 2016.  I am hopeful her left renal lesion will prove to be benign, but if there is any question about it being malignant I will refer her to urology. I expect the liver lesions to be clearly cystic but if there is any question there we may need to proceed to biopsy.  All this may take a couple of weeks and accordingly I will plan to  see Kortnee again in about 3 weeks. By that time we should have answers to these questions and she should be able to make a definitive decision regarding whether or not to start anti-estrogens   She knows to call for any problems that may develop before her next visit here.    Chauncey Cruel, MD 07/16/2015 6:43 PM

## 2015-07-17 ENCOUNTER — Telehealth: Payer: Self-pay | Admitting: Oncology

## 2015-07-17 NOTE — Telephone Encounter (Signed)
s.w. pt and advised on 5.26 appt.Marland KitchenMarland KitchenMarland KitchenMarland Kitchenpt ok and aware

## 2015-07-24 ENCOUNTER — Telehealth: Payer: Self-pay | Admitting: *Deleted

## 2015-07-24 ENCOUNTER — Other Ambulatory Visit: Payer: Self-pay | Admitting: *Deleted

## 2015-07-24 ENCOUNTER — Ambulatory Visit (HOSPITAL_COMMUNITY): Admission: RE | Admit: 2015-07-24 | Payer: BLUE CROSS/BLUE SHIELD | Source: Ambulatory Visit

## 2015-07-24 ENCOUNTER — Other Ambulatory Visit: Payer: Self-pay | Admitting: Oncology

## 2015-07-24 DIAGNOSIS — C50412 Malignant neoplasm of upper-outer quadrant of left female breast: Secondary | ICD-10-CM

## 2015-07-24 DIAGNOSIS — R932 Abnormal findings on diagnostic imaging of liver and biliary tract: Secondary | ICD-10-CM

## 2015-07-24 NOTE — Telephone Encounter (Signed)
Per MD- peer to peer with insurance company authorization obtained for MRI of abdomen with and without contrast. Authorization number [id number].  Note this RN entered per MD appropriate order for MRI with and without contrast.

## 2015-07-25 ENCOUNTER — Ambulatory Visit (HOSPITAL_COMMUNITY): Payer: BLUE CROSS/BLUE SHIELD

## 2015-07-29 ENCOUNTER — Ambulatory Visit (HOSPITAL_COMMUNITY)
Admission: RE | Admit: 2015-07-29 | Discharge: 2015-07-29 | Disposition: A | Discharge status: Home / Self Care | Payer: BLUE CROSS/BLUE SHIELD | Source: Ambulatory Visit | Attending: Oncology | Admitting: Oncology

## 2015-07-29 ENCOUNTER — Other Ambulatory Visit: Payer: Self-pay | Admitting: Oncology

## 2015-07-29 DIAGNOSIS — N289 Disorder of kidney and ureter, unspecified: Secondary | ICD-10-CM | POA: Diagnosis not present

## 2015-07-29 DIAGNOSIS — K769 Liver disease, unspecified: Secondary | ICD-10-CM | POA: Diagnosis not present

## 2015-07-29 DIAGNOSIS — C50412 Malignant neoplasm of upper-outer quadrant of left female breast: Secondary | ICD-10-CM

## 2015-07-29 MED ORDER — GADOBENATE DIMEGLUMINE 529 MG/ML IV SOLN
15.0000 mL | Freq: Once | INTRAVENOUS | Status: AC | PRN
  Administered 2015-07-29: 11 mL via INTRAVENOUS

## 2015-07-29 NOTE — Progress Notes (Unsigned)
Rayford Halsted and gave her the results of the MRI. She will need a follow-up MRI of the liver in about 6 months. She understands I'm placing a referral to urology for discussion of the renal lesion.

## 2015-08-23 ENCOUNTER — Ambulatory Visit (HOSPITAL_BASED_OUTPATIENT_CLINIC_OR_DEPARTMENT_OTHER): Payer: BLUE CROSS/BLUE SHIELD | Admitting: Oncology

## 2015-08-23 ENCOUNTER — Telehealth: Payer: Self-pay | Admitting: Oncology

## 2015-08-23 VITALS — BP 110/63 | HR 73 | Temp 98.4°F | Resp 18 | Wt 122.7 lb

## 2015-08-23 DIAGNOSIS — N289 Disorder of kidney and ureter, unspecified: Secondary | ICD-10-CM

## 2015-08-23 DIAGNOSIS — N2889 Other specified disorders of kidney and ureter: Secondary | ICD-10-CM | POA: Insufficient documentation

## 2015-08-23 DIAGNOSIS — K769 Liver disease, unspecified: Secondary | ICD-10-CM

## 2015-08-23 DIAGNOSIS — Z170 Estrogen receptor positive status [ER+]: Secondary | ICD-10-CM

## 2015-08-23 DIAGNOSIS — C50412 Malignant neoplasm of upper-outer quadrant of left female breast: Secondary | ICD-10-CM | POA: Diagnosis not present

## 2015-08-23 DIAGNOSIS — C649 Malignant neoplasm of unspecified kidney, except renal pelvis: Secondary | ICD-10-CM | POA: Diagnosis not present

## 2015-08-23 NOTE — Progress Notes (Signed)
[place]  [phone] Fax:([phone]) [place] Note   Patient Care Team: Louretta Shorten, MD as PCP - General (Obstetrics and Gynecology) Louretta Shorten, MD as Consulting Physician (Obstetrics and Gynecology) Autumn Messing III, MD as Consulting Physician (General Surgery) Truitt Merle, MD as Consulting Physician (Hematology) Thea Silversmith, MD as Consulting Physician (Radiation Oncology) Rockwell Germany, RN as Registered Nurse Mauro Kaufmann, RN as Registered Nurse Holley Bouche, NP as Nurse Practitioner (Nurse Practitioner) Angelina Ok, MD as Referring Physician (Surgery) Tressa Busman, MD (Plastic Surgery) Ardis Hughs, MD as Attending Physician (Urology) 08/23/2015  CHIEF COMPLAINT: Estrogen receptor positive breast cancer  CURRENT TREATMENT: observation     Breast cancer of upper-outer quadrant of left female breast ([place])   06/26/2014 Breast US Gallagher 1 x 0.9 x 0.8 cm irregular hypoechoic mass at the 4 o'clock position 4 cm from the nipple.Suspicious masses at the 12:30 position, 1 o'clock position and 4 o'clock position of the left breast - tissue sampling of thesemasses recommended.    06/26/2014 Breast US Gallagher 1.2 x 1 x 1.3 cm suspicious irregular hypoechoic area/mass at the 12:30 position 2 cm from the nipple, likely representing the architectural distortion identified mammographically. Gallagher 0.8 x 0.8 x 0.9 cm irregular hypoechoic mass at the 1 o'clock    07/02/2014 Initial Biopsy 1. Breast, left, needle core biopsy, 1, 12:30 and 4  o'clock position - INVASIVE MAMMARY CARCINOMA. - MAMMARY CARCINOMA IN SITU.    07/02/2014 Receptors her2 Estrogen Receptor: 100%, POSITIVE, STRONG STAINING INTENSITY Progesterone Receptor: 98%, POSITIVE, STRONG STAINING INTENSITY Proliferation Marker Ki67: 10%, HER-2/NEU BY CISH - NEGATIVE.   07/03/2014 Initial Diagnosis Breast cancer of upper-outer quadrant of left female breast   07/10/2014 Breast MRI 1. Biopsy marker  clip artifact in the 12:30 o'clock position of the left breast at the location of biopsy-proven invasive mammary carcinoma without Gallagher separate visible enhancing mass at this time.    07/10/2014 Breast MRI 2. 1.3 cm biopsy-proven invasive mammary carcinoma in the midportion of the upper-outer quadrant of the left breast. 3. 1.3 cm biopsy-proven invasive mammary carcinoma in the posterior aspect of the lower outer quadrant of the left breast.    07/10/2014 Breast MRI 4. 0.8 cm rounded, mildly irregular enhancing mass in the posterior aspect of the upper-outer quadrant of the left breast, 0.8 cm similar-appearing mass in the posterior aspect of the lower outer quadrant of the left breast and adjacent 0.8 cm similar-   07/10/2014 Breast MRI 1.4 cm mildly irregular oval, enhancing mass in the anterior aspect of the lower inner quadrant of the right breast. This is suspicious for the possibility of malignancy.    HISTORY OF PRESENTING ILLNESS:  From Dr. [name] 07/11/2014 intake note:  "Stacy Gallagher 55 y.o. female is here because of newly diagnosed breast cancer  This was discovered by screening mammogram. Her last mammogram was 2 years ago which was normal. Her diagnostic mammogram showed 3 lesions in the left breast, measuring up to 1.3 cm, with the largest distance about 5.3 cm. 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 Ki-67 6-11%. [name] on 07/10/2014 showed 3 additional small lesions in the left breast and Gallagher 1.4 cm mass in the right breast, she is scheduled to have Gallagher right breast mass biopsy on April 18.   She denies any palpable mass in the breast or axilla  before the screening. She feels very well overall, denies any pain, fatigue, change of her appetite or weight loss lately. She denies any other symptoms."  Her subsequent history is as detailed below  INTERVAL  HISTORY: Stacy Gallagher returns today for follow-up of her breast and renal cell carcinomas. Since her last visit here she met with Dr. Louis Meckel. He reviewed her abdominal MRI. I do not have Gallagher copy of his note, but the patient tells me he felt the lesion was small enough and had not changed significantly since the prior study so further observation was an option. Other options of course would be cryotherapy and surgery. The patient opted for observation.  The previously noted liver in home O2 90 was also begin imaged. Please see Dr. Maximino Sarin discussion on the report below. I think it is very unlikely that this represents metastatic breast cancer. It does however require further evaluation.  Stacy Gallagher is here today also to discuss systemic therapy for her breast cancer  REVIEW OF SYSTEMS Stacy Gallagher continues to work full time and she is doing quite Gallagher bit of "cardio" at [employer]. She is doing stretching exercises which she says her very helpful regarding the reconstruction. Otherwise Gallagher detailed review of systems today was noncontributory  MEDICAL HISTORY:  Past Medical History  Diagnosis Date  . Breast cancer of upper-outer quadrant of left female breast ([place]) 07/03/2014  . S/P mastectomy left    SURGICAL HISTORY: Past Surgical History  Procedure Laterality Date  . 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 She is divorced. At home it's she and her dad Stacy Gallagher and the patient's younger son Stacy Gallagher, currently 65 years old. The patient's 44 year old son Stacy Gallagher owns his own business. History   Social History  . Marital Status: Divorced    Spouse Name: N/Gallagher  . Number of Children: 2, age of 83 and 71 as of April 2017   . Years of Education: N/Gallagher   Occupational History  .  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 Smoking status: Never Smoker   . Smokeless tobacco: Not on file  . Alcohol Use: No  . Drug Use: No  . Sexual Activity: Not on file   Other Topics Concern  . Not on file   Social History Narrative    FAMILY HISTORY: Family History  Problem Relation Age of Onset  . Colon cancer Maternal Aunt   . Cancer Maternal Aunt 65    colon cancer   . Throat cancer Maternal Grandmother   . Cancer Maternal Grandmother   The patient's father is alive, at age 56. The patient's mother died at the age of 32 from noncancer related causes. The patient has 2 brothers, 1 sister. There is no history of breast or ovarian cancer in the family.  ALLERGIES:  has No Known Allergies.  MEDICATIONS:  No outpatient encounter prescriptions on file as of 08/23/2015.   No facility-administered encounter medications on file as of 08/23/2015.   PHYSICAL EXAMINATION: ECOG PERFORMANCE STATUS: 0 - Asymptomatic  Filed Vitals:   08/23/15 1053  BP: 110/63  Pulse: 73  Temp: 98.4 F (36.9 C)  Resp: 18   Filed Weights   08/23/15 1053  Weight: 122 lb 11.2 oz (55.656 kg)    Sclerae  unicteric, pupils round and equal Oropharynx clear and moist-- no thrush or other lesions No cervical or supraclavicular adenopathy Lungs no rales or rhonchi Heart regular rate and rhythm Abd soft, nontender, positive bowel sounds MSK no focal spinal tenderness, no upper extremity lymphedema Neuro: nonfocal, well oriented, appropriate affect Breasts: deferred     LABORATORY DATA:   Lab Results  Component Value Date   WBC 5.9 07/16/2015   HGB 12.9 07/16/2015   HCT 39.3 07/16/2015   MCV 88.3 07/16/2015   PLT 208 07/16/2015    Recent Labs  07/16/15 1518  NA 140  K 3.7  CO2 28  GLUCOSE 94  BUN 11.5  CREATININE 0.8  CALCIUM 9.3  PROT 6.8  ALBUMIN 3.7  AST 21  ALT 12  ALKPHOS 46  BILITOT 0.36   STUDIES: Mr Abdomen W Wo Contrast  07/29/2015  CLINICAL DATA:  Breast cancer. Left  renal and hepatic lesions. The report from Gallagher prior CTA of the abdomen and pelvis from [facility] revealed Gallagher 9 mm arterial enhancing lesion in the interpolar region of the left kidney suspicious for renal cell carcinoma, multiple small bilateral renal lesions too small to characterize, in the indeterminate 9 mm lesion in segment 6 of the liver, and Gallagher 1 cm arterial enhancing lesion in segment 6 of the liver. EXAM: MRI ABDOMEN WITHOUT AND WITH CONTRAST TECHNIQUE: Multiplanar multisequence MR imaging of the abdomen was performed both before and after the administration of intravenous contrast. CONTRAST:  47m MULTIHANCE GADOBENATE DIMEGLUMINE 529 MG/ML IV SOLN COMPARISON:  [facility] FINDINGS: Lower chest:  Left mastectomy. Hepatobiliary: 8 by 8 mm cyst in segment 6 of the liver shown on image 46/[id number]. Separate from this and along the inferior - posterior-lateral periphery of segment 6 of the liver, there is Gallagher pyramidal shaped lesion with faintly increased T2 and reduced T1 signal characteristics extending to Gallagher small area of capsular indentation. This second lesion has faintly accentuated arterial phase enhancement as shown for example on image [id number], still visible although less conspicuous on the later portal venous and delayed phase images. This lesion measures proximally 1.1 by 2.3 cm. Pancreas: Pancreas divisum. Spleen: Unremarkable Adrenals/Urinary Tract: Along the posteromedial left mid to lower kidney, there is Gallagher 1.0 by 0.9 cm enhancing lesion for example on image [id number], slightly reduced T2 signal, suspicious for Gallagher small renal cell carcinoma. No additional significant renal abnormality. Adrenal glands normal. Stomach/Bowel: Unremarkable Vascular/Lymphatic: Unremarkable Other: No supplemental non-categorized findings. Musculoskeletal: Unremarkable IMPRESSION: 1. 1.0 by 0.9 cm enhancing lesion  posteromedially in the left mid to lower kidney, likely Gallagher small renal cell carcinoma. Based on the prior report from last June, this is not changed in size since that time. 2. The enhancing lesion in the liver in segment 6 is less specific. This has Gallagher very faint triangular appearance on the axial images extending to the capsule where there is some very subtle capsular indentation. This area has high T2 and faintly reduced T1 signal characteristics and faint generalized enhancement on all sequences including the arterial phase. Possibilities include scarring or some type of small focal infiltrative lesion. An unusual area of focal nodular hyperplasia could have this appearance. The appearance resembles transient hepatic attenuation difference but given the persistence on the delayed images this does not relate qualify as transient. Although this would be Gallagher very unusual manifestation of metastatic breast cancer, surveillance imaging would likely be  prudent to confirm indicate lack of progression. The morphology and indistinct nature of this lesion makes measuring it difficult, and I am unsure if today's appearance represents change in size from the prior. Electronically Signed   By: Gaylyn Rong M.D.   On: 07/29/2015 08:33    ASSESSMENT:  (1) 55 y.o.  [place] woman status post left breast biopsy 3 06/29/2014 for identical invasive mammary carcinomas with lobular features, estrogen receptor and progesterone receptor both strongly positive, MIB-1 in the 6-11% range, with HER-2 not amplified ([id number])  (Gallagher) biopsy of an inner lower quadrant right breast lesion was benign and concordant, 07/16/2014  (2) status post left mastectomy and sentinel lymph node sampling 09/12/2014 for an mpT2 pN0, stage IIA  invasive lobular breast cancer (E-cadherin negative, but with some ductal features), grade 2, again estrogen and progesterone receptor positive and HER-2 negative ([id number] at [facility])  (Gallagher) deep margin  was less than Gallagher millimeter, but negative  (b) Gallagher total of 2 sentinel lymph nodes were removed  (c) immediate DIEP reconstruction  (d) status post right mastopexy with benign pathology 01/16/2015  (3) adjuvant therapy: the patient opted against adjuvant systemic therapy with tamoxifen  (4) CT angiogram of the abdomen and pelvis on 09/18/2014 shows Gallagher 0.9 cm left renal lesion suspicious for renal cell carcinoma, and 2 indeterminate hepatic lesions measuring 1.0 and 0.9 cm.  (Gallagher) MRI of the abdomen 07/29/2015 shows the renal lesion to be essentially stable; the single liver lesion noted was indeterminate  PLAN: I spent approximately 45 minutes today with Nicholaus Bloom going over her situation in detail with regards to both her renal and breast cancer situations.  Normally after surgery Gallagher patient with stage II breast cancer like her would've had an Oncotype to help decide the chemotherapy question. At this late date however there is no data thatproceeding with chemotherapy would be helpful  However tamoxifen is standard of care at this point. There is Gallagher significant decrease in the risk of recurrence with tamoxifen in women like her. We discussed the possible side effects, toxicities and complications of this agent. We also discussed the mechanism of action and she understands tamoxifen does not "take away her estrogen". It works by Gallagher "blocking" the estrogen receptor in breast cancer cells. She also understands even though she is 35 she is continuing to make estrogen.In fact she recently resumed her periods.  Yania tells me at this point she cannot think of introducing Gallagher chemical into her body. She has discussed this with Gallagher friend who had breast cancer and went on Gallagher strict diet and the breast cancer was cured. Arlys wants to try the "alternative route".  She understands that when breast cancer recurs it usually is stage IV and therefore incurable. She is also aware that while her margins were negative, they were  close.  Accordingly as far as the breast cancer is concerned the plan will be for observation alone.That seems to be the plan as well for the renal cell carcinoma. I have set her up for repeat MRI of the abdomen with and without contrast in early November. That way if she decides to have Gallagher procedure on the renal tumor she can have it done this year. I will make sure Dr. Marlou Porch gets Gallagher copy. This of course will give [place] another reading on the liver spot as well, though  she understands my feeling is this is not going to be related to either of her cancers.  Taquita knows to call  for any other problems that may develop before her next visit here.   Chauncey Cruel, MD 08/23/2015 11:24 AM

## 2015-08-23 NOTE — Addendum Note (Signed)
Addended by: Chauncey Cruel on: 08/23/2015 11:46 AM   Modules accepted: Orders

## 2015-08-23 NOTE — Telephone Encounter (Signed)
appt made and avs printed. MRI to be scheduled by radiology

## 2015-11-18 ENCOUNTER — Other Ambulatory Visit: Payer: Self-pay | Admitting: Obstetrics and Gynecology

## 2015-11-18 DIAGNOSIS — Z853 Personal history of malignant neoplasm of breast: Secondary | ICD-10-CM

## 2015-11-22 ENCOUNTER — Ambulatory Visit
Admission: RE | Admit: 2015-11-22 | Discharge: 2015-11-22 | Disposition: A | Discharge status: Home / Self Care | Payer: BLUE CROSS/BLUE SHIELD | Source: Ambulatory Visit | Attending: Obstetrics and Gynecology | Admitting: Obstetrics and Gynecology

## 2015-11-22 ENCOUNTER — Other Ambulatory Visit: Payer: Self-pay | Admitting: Oncology

## 2015-11-22 ENCOUNTER — Ambulatory Visit
Admission: RE | Admit: 2015-11-22 | Discharge: 2015-11-22 | Disposition: A | Discharge status: Home / Self Care | Payer: BLUE CROSS/BLUE SHIELD | Source: Ambulatory Visit | Attending: Oncology | Admitting: Oncology

## 2015-11-22 DIAGNOSIS — R932 Abnormal findings on diagnostic imaging of liver and biliary tract: Secondary | ICD-10-CM

## 2015-11-22 DIAGNOSIS — Z853 Personal history of malignant neoplasm of breast: Secondary | ICD-10-CM

## 2015-11-22 DIAGNOSIS — C50412 Malignant neoplasm of upper-outer quadrant of left female breast: Secondary | ICD-10-CM

## 2016-01-20 ENCOUNTER — Other Ambulatory Visit: Payer: Self-pay | Admitting: Urology

## 2016-01-20 DIAGNOSIS — C642 Malignant neoplasm of left kidney, except renal pelvis: Secondary | ICD-10-CM

## 2016-02-04 IMAGING — MR MR BREAST BX W/ LOC DEV 1ST LEASION IMAGE BX SPEC MR GUIDE*R*
5 of 7 series · 33 of 48 positions shown · IV contrast (12ml Multihance)
Comparison: Previous exams.

ADDENDUM:
Pathology revealed Right benign breast parenchyma. This was found to
be concordant by Dr. Lorenz Jumper. Pathology was discussed with
the patient via telephone. The patient reported no problems with the
biopsy site. Post biopsy instructions were reviewed and her
questions were answered. The patient was encouraged to call The
[REDACTED] with any additional questions
and/or concerns. The patient has a recent diagnosis of left breast
cancer and was encouraged to follow her treatment plan.

Pathology results reported by Chai Tiger RN on July 18, 2014.
CLINICAL DATA: Patient with recent history of 3 biopsy proven
malignancies left breast. Right breast 1.4 cm oval mildly irregular
enhancing mass over the inner lower quadrant noted on recent MRI.
EXAM:
MRI GUIDED CORE NEEDLE BIOPSY OF THE RIGHT BREAST
TECHNIQUE: Multiplanar, multisequence MR imaging of the RIGHT breast was
performed both before and after administration of intravenous
contrast.
CONTRAST:  12 mL MultiHance

[Series 3: axial pre-cm · axial · non-contrast · 1.3mm · 0.73mm/px · z∈[-92,+93]mm · 6 of 144 slices shown]
[im 1/144]
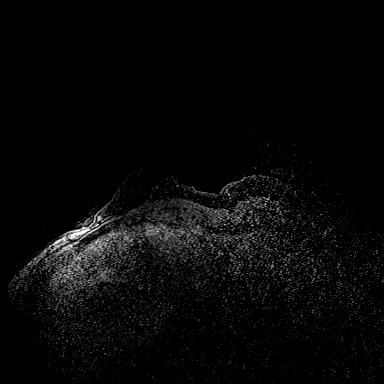
[im 29/144]
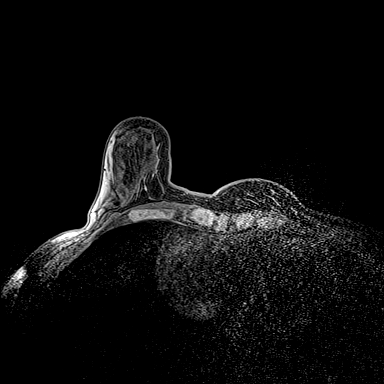
[im 58/144]
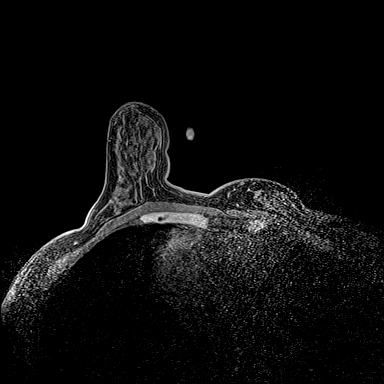
[im 86/144]
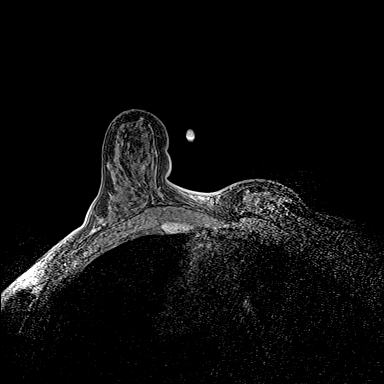
[im 115/144]
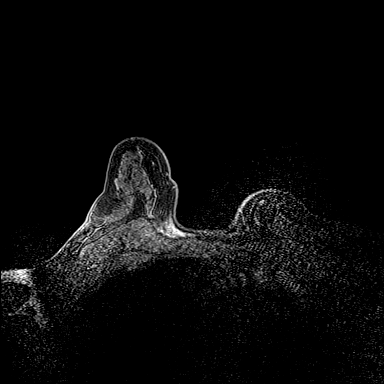
[im 144/144]
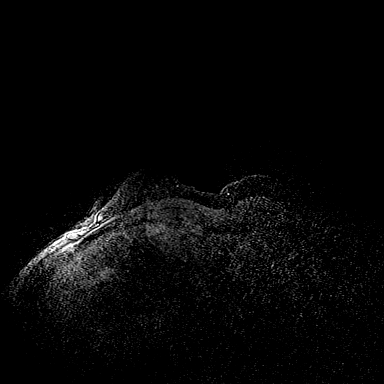

[Series 4: axial post 20 · axial · 1.3mm · 0.73mm/px · z∈[-92,+93]mm · 7 of 144 slices shown (1 of 2)]
[im 1/144]
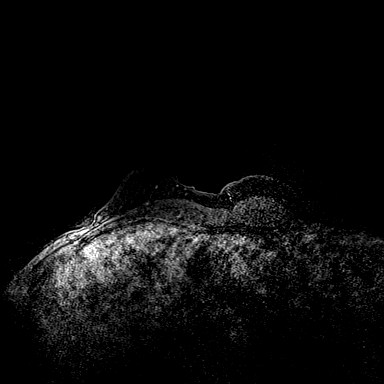
[im 24/144]
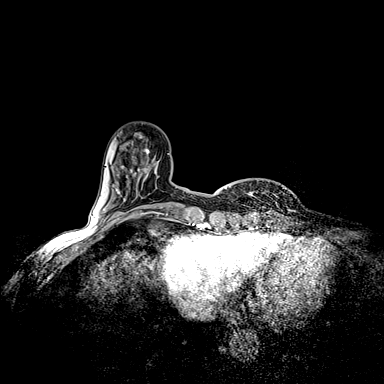
[im 48/144]
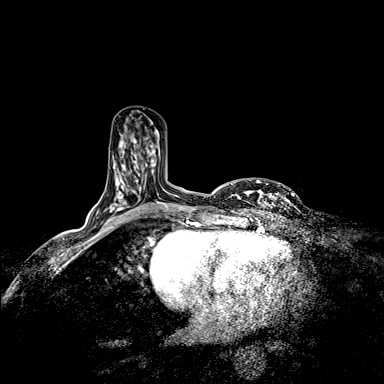
[im 72/144]
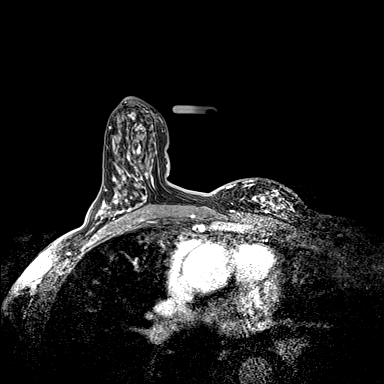
[im 96/144]
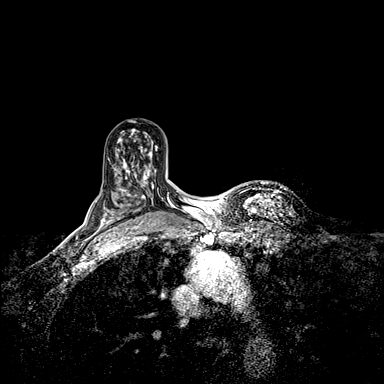
[im 120/144]
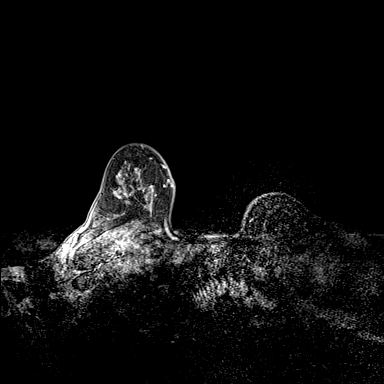
[im 144/144]
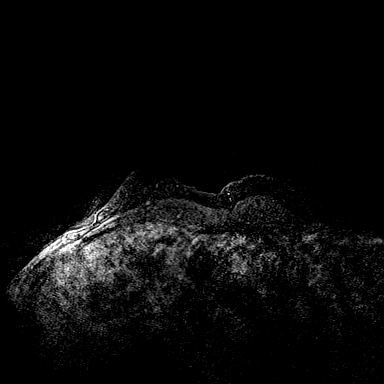

[Series 5: axial post 20 · axial · 1.3mm · 0.73mm/px · z∈[-92,+93]mm · 7 of 144 slices shown (2 of 2)]
[im 1/144]
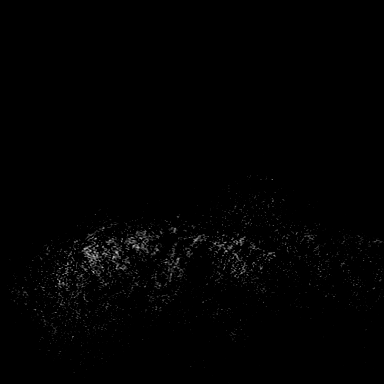
[im 24/144]
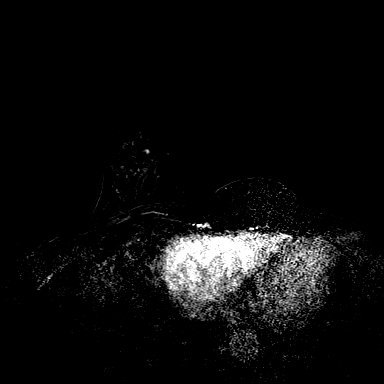
[im 48/144]
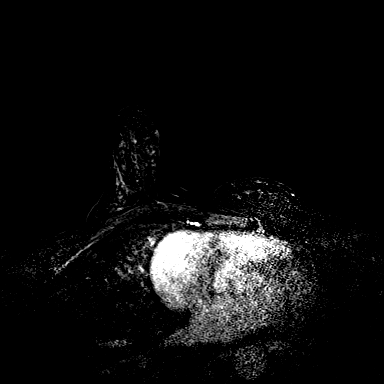
[im 72/144]
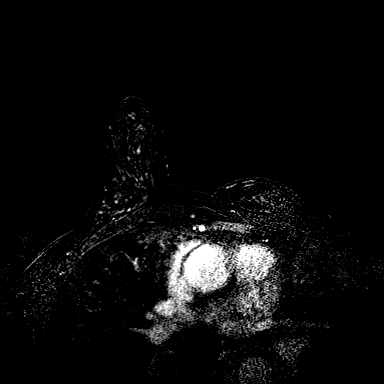
[im 96/144]
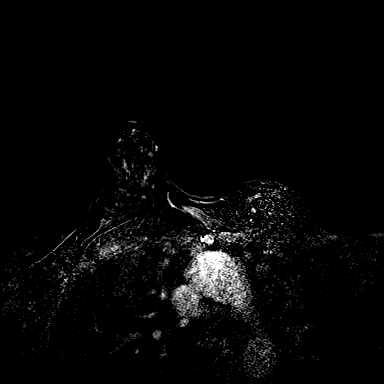
[im 120/144]
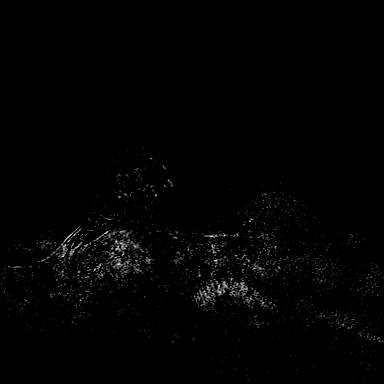
[im 144/144]
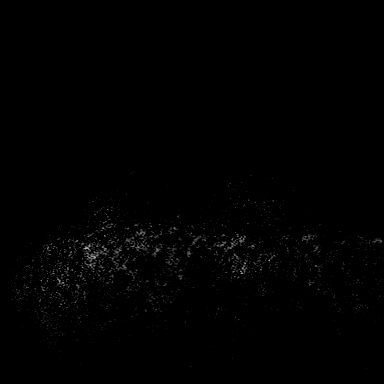

[Series 8: axial confirmation · axial · 1.3mm · 0.73mm/px · z∈[-92,+93]mm · 7 of 144 slices shown]
[im 1/144]
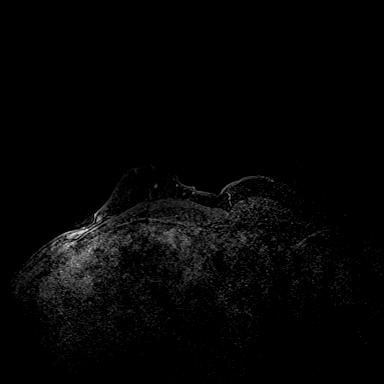
[im 24/144]
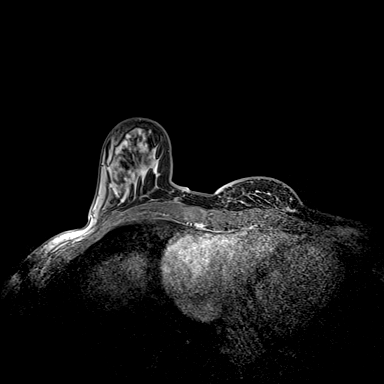
[im 48/144]
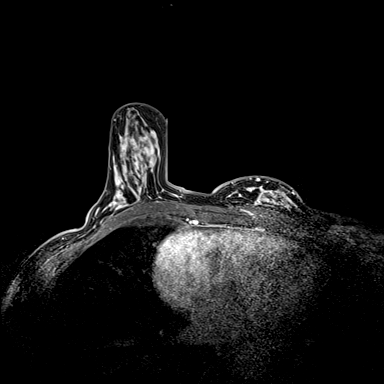
[im 72/144]
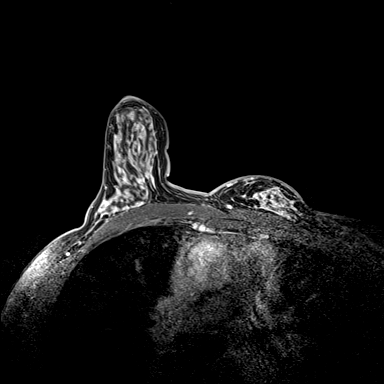
[im 96/144]
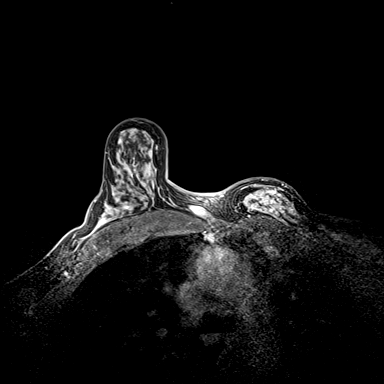
[im 120/144]
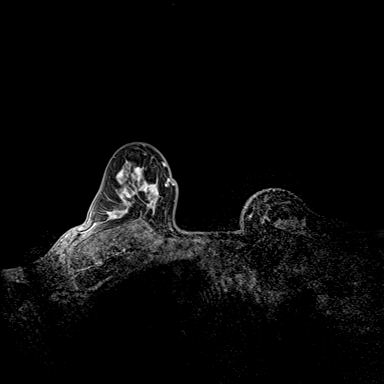
[im 144/144]
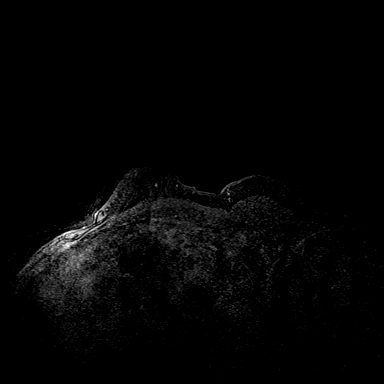

[Series 9: axial confirmation_sub · axial · 1.3mm · 0.73mm/px · z∈[-92,+62]mm · 6 of 144 slices shown]
[im 1/144]
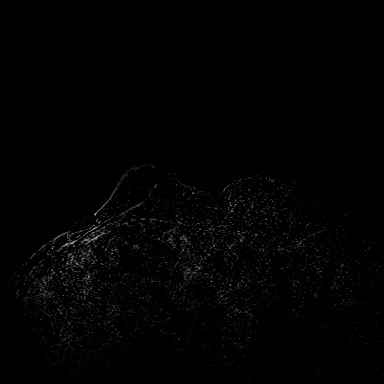
[im 24/144]
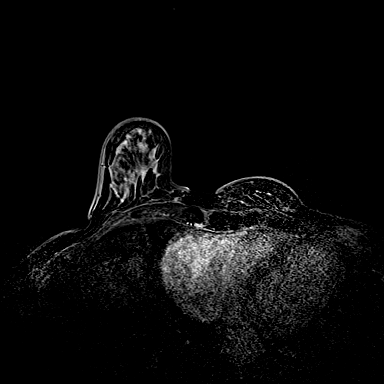
[im 48/144]
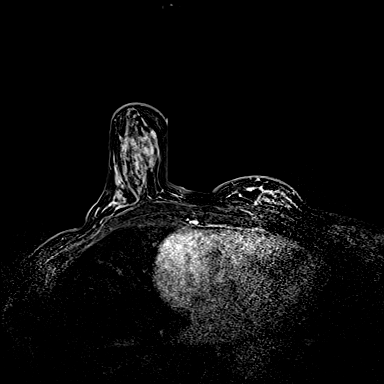
[im 72/144]
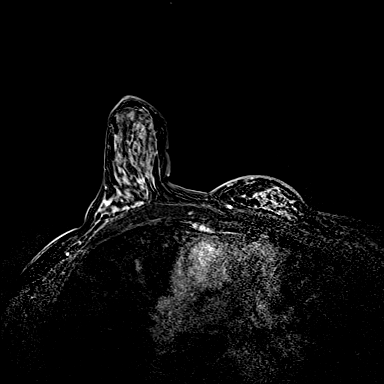
[im 96/144]
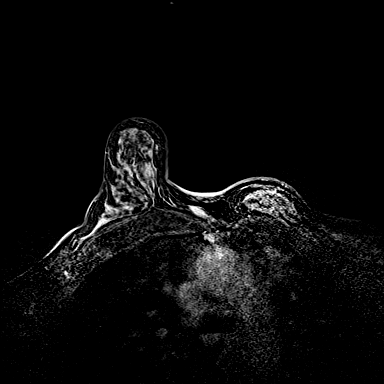
[im 120/144]
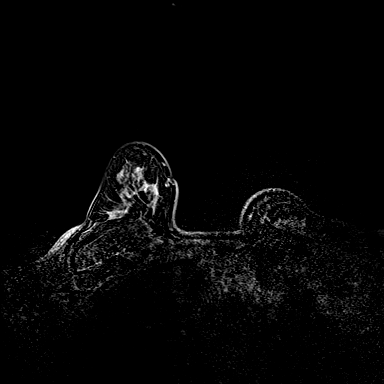

[33 of 48 positions shown; findings below may reference images not displayed]

FINDINGS: I met with the patient, and we discussed the procedure of MRI guided
biopsy, including risks, benefits, and alternatives. Specifically,
we discussed the risks of infection, bleeding, tissue injury, clip
migration, and inadequate sampling. Informed, written consent was
given. The usual time out protocol was performed immediately prior
to the procedure.

Using sterile technique, 2% Lidocaine, MRI guidance, and a 9 gauge
vacuum assisted device, biopsy was performed of the targeted mass
over the right inner lower quadrant using a medial to lateral
approach. At the conclusion of the procedure, a cylindrical shaped
tissue marker clip was deployed into the biopsy cavity. Patient
refused post clip placement mammographic imaging.
IMPRESSION: MRI guided biopsy of right breast mass inner lower quadrant.. No
apparent complications.

## 2016-02-06 ENCOUNTER — Other Ambulatory Visit: Payer: BLUE CROSS/BLUE SHIELD

## 2016-02-10 ENCOUNTER — Ambulatory Visit (HOSPITAL_COMMUNITY)
Admission: RE | Admit: 2016-02-10 | Discharge: 2016-02-10 | Disposition: A | Discharge status: Home / Self Care | Payer: BLUE CROSS/BLUE SHIELD | Source: Ambulatory Visit | Attending: Urology | Admitting: Urology

## 2016-02-10 DIAGNOSIS — C642 Malignant neoplasm of left kidney, except renal pelvis: Secondary | ICD-10-CM | POA: Diagnosis not present

## 2016-02-10 DIAGNOSIS — K769 Liver disease, unspecified: Secondary | ICD-10-CM | POA: Insufficient documentation

## 2016-02-10 MED ORDER — GADOBENATE DIMEGLUMINE 529 MG/ML IV SOLN
10.0000 mL | Freq: Once | INTRAVENOUS | Status: AC | PRN
  Administered 2016-02-10: 10 mL via INTRAVENOUS

## 2016-02-13 ENCOUNTER — Ambulatory Visit (HOSPITAL_BASED_OUTPATIENT_CLINIC_OR_DEPARTMENT_OTHER): Payer: BLUE CROSS/BLUE SHIELD | Admitting: Oncology

## 2016-02-13 VITALS — BP 123/66 | HR 71 | Temp 98.2°F | Resp 17 | Ht 64.5 in | Wt 120.2 lb

## 2016-02-13 DIAGNOSIS — N289 Disorder of kidney and ureter, unspecified: Secondary | ICD-10-CM | POA: Diagnosis not present

## 2016-02-13 DIAGNOSIS — C50412 Malignant neoplasm of upper-outer quadrant of left female breast: Secondary | ICD-10-CM

## 2016-02-13 DIAGNOSIS — R0609 Other forms of dyspnea: Secondary | ICD-10-CM

## 2016-02-13 DIAGNOSIS — K769 Liver disease, unspecified: Secondary | ICD-10-CM | POA: Diagnosis not present

## 2016-02-13 DIAGNOSIS — Z170 Estrogen receptor positive status [ER+]: Secondary | ICD-10-CM

## 2016-02-13 NOTE — Progress Notes (Signed)
[facility]  [phone] Fax:([phone]) [place] Note   Patient Care Team: Louretta Shorten, MD as PCP - General (Obstetrics and Gynecology) Louretta Shorten, MD as Consulting Physician (Obstetrics and Gynecology) Autumn Messing III, MD as Consulting Physician (General Surgery) Truitt Merle, MD as Consulting Physician (Hematology) Thea Silversmith, MD as Consulting Physician (Radiation Oncology) Rockwell Germany, RN as Registered Nurse Mauro Kaufmann, RN as Registered Nurse Holley Bouche, NP as Nurse Practitioner (Nurse Practitioner) Angelina Ok, MD as Referring Physician (Surgery) Tressa Busman, MD (Plastic Surgery) Ardis Hughs, MD as Attending Physician (Urology) 02/13/2016  CHIEF COMPLAINT: Estrogen receptor positive breast cancer  CURRENT TREATMENT: observation     Breast cancer of upper-outer quadrant of left female breast ([place])   06/26/2014 Breast US    A 1 x 0.9 x 0.8 cm irregular hypoechoic mass at the 4 o'clock position 4 cm from the nipple.Suspicious masses at the 12:30 position, 1 o'clock position and 4 o'clock position of the left breast - tissue sampling of thesemasses recommended.       06/26/2014 Breast US    A 1.2 x 1 x 1.3 cm suspicious irregular hypoechoic area/mass at the 12:30 position 2 cm from the nipple, likely representing the architectural distortion identified mammographically. A 0.8 x 0.8 x 0.9 cm irregular hypoechoic mass at the 1 o'clock       07/02/2014 Initial Biopsy    1. Breast, left, needle core biopsy, 1, 12:30 and 4  o'clock position - INVASIVE MAMMARY CARCINOMA. - MAMMARY CARCINOMA IN SITU.       07/02/2014 Receptors her2    Estrogen Receptor: 100%, POSITIVE, STRONG STAINING INTENSITY Progesterone Receptor: 98%, POSITIVE, STRONG STAINING INTENSITY Proliferation Marker Ki67: 10%, HER-2/NEU BY CISH - NEGATIVE.      07/03/2014 Initial Diagnosis    Breast cancer of upper-outer quadrant of left female breast      07/10/2014 Breast MRI    1. Biopsy marker clip artifact in the 12:30 o'clock position of the left breast at the location of biopsy-proven invasive mammary carcinoma without a separate visible enhancing mass at this time.       07/10/2014 Breast MRI    2. 1.3 cm biopsy-proven invasive mammary carcinoma in the midportion of the upper-outer quadrant of the left breast. 3. 1.3 cm biopsy-proven invasive mammary carcinoma in the posterior aspect of the lower outer quadrant of the left breast.       07/10/2014 Breast MRI    4. 0.8 cm rounded, mildly irregular enhancing mass in the posterior aspect of the upper-outer quadrant of the left breast, 0.8 cm similar-appearing mass in the posterior aspect of the lower outer quadrant of the left breast and adjacent 0.8 cm similar-      07/10/2014 Breast MRI    1.4 cm mildly irregular oval, enhancing mass in the anterior aspect of the lower inner quadrant of the right breast. This is suspicious for the possibility of malignancy.       HISTORY OF PRESENTING ILLNESS:  From Dr. [name] 07/11/2014 intake note:  "Stacy Gallagher 55 y.o. female is here because of newly diagnosed breast cancer  This was discovered by screening mammogram. Her last mammogram was 2 years ago which was normal. Her diagnostic mammogram showed 3 lesions in the left breast, measuring up to 1.3 cm, with the largest distance about 5.3 cm. She underwent left breast biopsy of these 3 lesions which showed similar morphology, invasive ductal carcinoma and DCIS, grade 2, ER/PR  positive HER-2 negative. Ki-67 6-11%. [name] on 07/10/2014 showed 3 additional small lesions in the left breast and a 1.4 cm mass in the right breast, she is scheduled to have a right breast mass biopsy on April 18.   She denies any palpable mass in the breast or axilla before the screening. She feels very well overall, denies any pain, fatigue, change of her appetite or weight loss lately. She denies any other  symptoms."  Her subsequent history is as detailed below  INTERVAL HISTORY: [place] returns today for follow-up of her breast Cancer and her liver and kidney lesions. As far as the breast cancer is concerned, she is under observation alone as she refuses tamoxifen or other anti-estrogens. As far as the kidney and liver lesions we just obtained a repeat MRI which is very favorable. The liver lesion is entirely unchanged and likely represents scar. The kidney lesion is actually smaller with less enhancement and now is favored to be a proteinaceous cyst.  REVIEW OF SYSTEMS [name] has had 2 or 3 episodes of severe pain in the epigastric area, on one occasion accompanied by vomiting and sweating. This is extremely intermittent and has not been associated with any change in bowel habits. Aside from this issue she has occasional hot flashes. Every month she still has pre-menstrual symptoms but of course she no longer has menses. A detailed review of systems today was otherwise stable.  MEDICAL HISTORY:  Past Medical History:  Diagnosis Date  . Breast cancer of upper-outer quadrant of left female breast ([place]) 07/03/2014  . S/P mastectomy left    SURGICAL HISTORY: Past Surgical History:  Procedure Laterality Date  . 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 She is divorced. At home it's she and her dad [name] and the patient's younger son [name], currently 27 years old. The patient's 16 year old son [name] owns his own business. History   Social History  . Marital Status: Divorced    Spouse Name: N/A  . Number of Children: 2, age of 91 and 50 as of April 2017   . Years of Education: N/A   Occupational History  .  she has her own business of a [employer] Topics  . Smoking status:  Never Smoker   . Smokeless tobacco: Not on file  . Alcohol Use: No  . Drug Use: No  . Sexual Activity: Not on file   Other Topics Concern  . Not on file   Social History Narrative    FAMILY HISTORY: Family History  Problem Relation Age of Onset  . Colon cancer Maternal Aunt   . Cancer Maternal Aunt 65    colon cancer   . Throat cancer Maternal Grandmother   . Cancer Maternal Grandmother   The patient's father is alive, at age 96. The patient's mother died at the age of 28 from noncancer related causes. The patient has 2 brothers, 1 sister. There is no history of breast or ovarian cancer in the family.  ALLERGIES:  has No Known Allergies.  MEDICATIONS:  No outpatient encounter prescriptions on file as of 02/13/2016.   No facility-administered encounter medications on file as of 02/13/2016.    PHYSICAL EXAMINATION: ECOG PERFORMANCE STATUS: 1 - Symptomatic but completely ambulatory  Vitals:   02/13/16 0902  BP: 123/66  Pulse: 71  Resp: 17  Temp: 98.2 F (36.8 C)   Filed Weights   02/13/16 0902  Weight: 120 lb 3.2 oz (54.5 kg)    Sclerae unicteric, EOMs intact Oropharynx clear and moist No cervical or supraclavicular adenopathy Lungs no rales or rhonchi Heart regular rate and rhythm Abd soft, nontender, positive bowel sounds MSK no focal spinal tenderness, no upper extremity lymphedema Neuro: nonfocal, well oriented, appropriate affect Breasts: The right breast is status post reduction mammoplasty. There are no findings of concern to the left breast is status post mastectomy with D IEP reconstruction. There is no evidence of local recurrence. Both axillae are benign.  LABORATORY DATA:   Lab Results  Component Value Date   WBC 5.9 07/16/2015   HGB 12.9 07/16/2015   HCT 39.3 07/16/2015   MCV 88.3 07/16/2015   PLT 208 07/16/2015    Recent Labs  07/16/15 1518  NA 140  K 3.7  CO2 28  GLUCOSE 94  BUN 11.5  CREATININE 0.8  CALCIUM 9.3  PROT 6.8    ALBUMIN 3.7  AST 21  ALT 12  ALKPHOS 46  BILITOT 0.36   STUDIES: Mr Abdomen Wwo Contrast  Result Date: 02/10/2016 CLINICAL DATA:  55 year old female with history of left-sided breast cancer status post mastectomy and history of renal lesion noted on prior examination suspicious for renal cell carcinoma. Indeterminate liver lesion noted on prior examination. Follow-up study. EXAM: MRI ABDOMEN WITHOUT AND WITH CONTRAST TECHNIQUE: Multiplanar multisequence MR imaging of the abdomen was performed both before and after the administration of intravenous contrast. CONTRAST:  21m MULTIHANCE GADOBENATE DIMEGLUMINE 529 MG/ML IV SOLN COMPARISON:  Abdominal MRI 07/29/2015. FINDINGS: Lower chest: Unremarkable. Hepatobiliary: The lesion of concern in the posterior aspect of segment 6 of the liver is again noted, appreciated best on portal venous phase post gadolinium images (image 38 of series 1202) as a wedge-shaped area of very mild hyperenhancement which normalizes on other post gadolinium sequences. This area slightly T1 hypointense and T2 hyperintense, and associated with some overlying capsular retraction, favored to represent an area of mild scarring, potentially from remote trauma or infection. Sub cm T1 hypointense, T2 hyperintense, nonenhancing lesion in segment 6 of the liver is unchanged, compatible with a tiny simple cysts. No other suspicious hepatic lesions are noted. No intra or extrahepatic biliary ductal dilatation. Common bile duct measures 5 mm in the porta hepatis. Gallbladder is normal in appearance. Pancreas: No pancreatic mass. No pancreatic ductal dilatation. No pancreatic or peripancreatic fluid or inflammatory changes. Spleen:  Unremarkable. Adrenals/Urinary Tract: Along the posterior medial aspect of the left kidney between interpolar and lower pole regions there is a very subtle 8 x 6 mm lesion which is T1 isointense, slightly T2 hypointense, and demonstrates equivocal enhancement,  favored to represent an involuting proteinaceous cyst. No other suspicious renal lesions are noted. No hydroureteronephrosis in the visualized abdomen. Stomach/Bowel: Visualized portions are unremarkable. Vascular/Lymphatic: No aneurysm identified in the visualized abdominal vasculature. No lymphadenopathy noted in the abdomen. Other: No significant volume of ascites in the visualized peritoneal cavity. Musculoskeletal: No aggressive osseous lesions are noted in the visualized portions of the skeleton. IMPRESSION: 1. Small indeterminate lesion in segment 6 of the liver has benign imaging characteristics and overall is stable compared to prior study 07/29/2015, most compatible with an area of scarring, likely related to remote trauma or prior infection. 2. Small indeterminate lesion in the post row medial aspect of the left kidney appears slightly smaller than  the prior examination. Enhancement within this lesion cannot be confirmed on today's study, and given the lack of definite internal enhancement and slight decreased size compared to the prior examination, this is favored to represent an involuting proteinaceous cyst. Repeat abdominal MRI with and without IV gadolinium is suggested in 2 years to ensure the stability or resolution of this lesion. Electronically Signed   By: Vinnie Langton M.D.   On: 02/10/2016 09:17    ASSESSMENT:  (1) 55 y.o.  [place] woman status post left breast biopsy 3 06/29/2014 for identical invasive mammary carcinomas with lobular features, estrogen receptor and progesterone receptor both strongly positive, MIB-1 in the 6-11% range, with HER-2 not amplified ([id number])  (a) biopsy of an inner lower quadrant right breast lesion was benign and concordant, 07/16/2014  (2) status post left mastectomy and sentinel lymph node sampling 09/12/2014 for an mpT2 pN0, stage IIA  invasive lobular breast cancer (E-cadherin negative, but with some ductal features), grade 2, again  estrogen and progesterone receptor positive and HER-2 negative ([id number] at [facility])  (a) deep margin was less than a millimeter, but negative  (b) a total of 2 sentinel lymph nodes were removed  (c) immediate DIEP reconstruction  (d) status post right mastopexy with benign pathology 01/16/2015  (3) adjuvant therapy: the patient opted against adjuvant systemic therapy with tamoxifen  (4) CT angiogram of the abdomen and pelvis on 09/18/2014 shows a 0.9 cm left renal lesion suspicious for renal cell carcinoma, and 2 indeterminate hepatic lesions measuring 1.0 and 0.9 cm.  (a) MRI of the abdomen 07/29/2015 shows the renal lesion to be essentially stable; the single liver lesion noted was indeterminate  (b)  MRI of the abdomen 02/10/2016 shows a liver lesion to be completely stable and the renal lesion to be smaller and less enhancing consistent with a proteinaceous cysts   PLAN: Today we reviewed the recent MRI results which are very favorable. Both the liver and kidney lesions now seem very likely to be entirely benign. I think this requires only follow-up and we will repeat an MRI of the abdomen in one year.  We again discussed tamoxifen. She tells me she does not want to take it because of the risk of ovarian cancer. I explained there is absolutely no relationship between tamoxifen and ovarian cancer. She tells me everything she reads tells her that there is a relationship. At any rate she has received no adjuvant systemic treatment for her breast cancer and while she expresses a little bit of anxiety regarding the possibility of recurrence she is not willing to consider anti-estrogens or any other standard medications  I think the pain she has experienced in her epigastrium area is most likely reflux. I gave her information on why tends to bring on reflux problems, and what she can do to avoid them. If the problem recurs I suggested she try Prilosec nightly for a month. However she may need GI  evaluation if the problem persists and she will call me if that is the case  Otherwise she knows to call for any problems that may develop before her next visit here, which will be one year.

## 2016-04-22 ENCOUNTER — Telehealth: Payer: Self-pay | Admitting: Emergency Medicine

## 2016-04-22 NOTE — Telephone Encounter (Signed)
Patient reports redness and "hardness" near her " belly button" States she had the Deep procedure for reconstruction. Denies any drainage to the area. States she is cleaning it with peroxide. Denies any fever. States she did not call her surgeon because he is in [place] and "is hard to get a hold of".

## 2016-04-24 ENCOUNTER — Telehealth: Payer: Self-pay | Admitting: *Deleted

## 2016-04-24 NOTE — Telephone Encounter (Signed)
This RN attempted to con

## 2016-04-24 NOTE — Telephone Encounter (Signed)
This RN called pt per follow up from call on Wednesday per surgical site concern.  Obtained VM - message left.  Per chart review noted pt did contact surgeon's office and is scheduled for visit today.

## 2016-08-27 ENCOUNTER — Other Ambulatory Visit: Payer: Self-pay

## 2016-08-27 ENCOUNTER — Other Ambulatory Visit: Payer: Self-pay | Admitting: Oncology

## 2016-08-27 DIAGNOSIS — C50412 Malignant neoplasm of upper-outer quadrant of left female breast: Secondary | ICD-10-CM

## 2016-08-27 DIAGNOSIS — Z170 Estrogen receptor positive status [ER+]: Principal | ICD-10-CM

## 2016-08-27 NOTE — Progress Notes (Signed)
Returned pt call regarding request to have her MRI scheduled for nov 2018,to be moved earlier due to increasing stomach pain. Pt states that she is not on any medications and has been having issues for a few weeks now. Pt states that she has not changed her diet, with the exception of giving up coffee. Pt denies diarrhea or constipation. Pt states that she had also started having some mild bleeding today, after 1 year of not having a period. Pt states that she is having some intermittent period pains but tolerable. Pt not sure what to do. Advised to call her gynecologist right away and report symptom of vaginal bleeding. Suggested that she makes an appt with them. Discussed with Dr.Magrinat regarding pt on going stomach issues and obtained order for abdominal xray. Pt denies any other symptoms at this time aside from what she had reported. Will contact pt with xray results and discuss plan further. Pt has no other concerns at this time.

## 2016-08-27 NOTE — Progress Notes (Unsigned)
Stacy Gallagher called regarding persistent lower abdominal pain. She had attributed this to coffee, quit drinking coffee, got better, resumed coffee, got worse, and then quit coffee definitively but the problem has not gone away. She is very concerned about this. We discussed various ways of evaluating it

## 2016-09-14 ENCOUNTER — Other Ambulatory Visit: Payer: Self-pay | Admitting: *Deleted

## 2016-09-14 ENCOUNTER — Telehealth: Payer: Self-pay | Admitting: *Deleted

## 2016-09-14 NOTE — Telephone Encounter (Signed)
Pt returned call - discussed need to reschedule / change CT to MRI.  Per conversation with t

## 2016-09-14 NOTE — Telephone Encounter (Signed)
This RN was notified that per pt's insurance - CT is not approved and would need to go for further review.  MRI does met approval and has an active authorization.  Pt is currently scheduled for CT tomorrow.  This RN contacted central scheduling and was able to obtain an appointment for MRI for 7pm tomorrow at [facility]. Instructions - NPO 4 hours prior and arrive at 630 pm.  This RN contacted pt and obtained identified VM- message left to return call per above.

## 2016-09-15 ENCOUNTER — Ambulatory Visit (HOSPITAL_COMMUNITY): Payer: BLUE CROSS/BLUE SHIELD

## 2016-09-15 ENCOUNTER — Ambulatory Visit (HOSPITAL_COMMUNITY): Admission: RE | Admit: 2016-09-15 | Payer: BLUE CROSS/BLUE SHIELD | Source: Ambulatory Visit

## 2016-09-16 ENCOUNTER — Other Ambulatory Visit: Payer: Self-pay | Admitting: *Deleted

## 2016-09-16 ENCOUNTER — Ambulatory Visit (HOSPITAL_COMMUNITY)
Admission: RE | Admit: 2016-09-16 | Discharge: 2016-09-16 | Disposition: A | Discharge status: Home / Self Care | Payer: BLUE CROSS/BLUE SHIELD | Source: Ambulatory Visit | Attending: Oncology | Admitting: Oncology

## 2016-09-16 ENCOUNTER — Ambulatory Visit: Payer: BLUE CROSS/BLUE SHIELD | Admitting: Oncology

## 2016-09-16 ENCOUNTER — Telehealth: Payer: Self-pay | Admitting: *Deleted

## 2016-09-16 ENCOUNTER — Encounter (HOSPITAL_COMMUNITY): Payer: Self-pay

## 2016-09-16 DIAGNOSIS — Z170 Estrogen receptor positive status [ER+]: Principal | ICD-10-CM

## 2016-09-16 DIAGNOSIS — N2889 Other specified disorders of kidney and ureter: Secondary | ICD-10-CM

## 2016-09-16 DIAGNOSIS — C50412 Malignant neoplasm of upper-outer quadrant of left female breast: Secondary | ICD-10-CM

## 2016-09-16 DIAGNOSIS — M25559 Pain in unspecified hip: Secondary | ICD-10-CM

## 2016-09-16 DIAGNOSIS — R935 Abnormal findings on diagnostic imaging of other abdominal regions, including retroperitoneum: Secondary | ICD-10-CM

## 2016-09-16 MED ORDER — GADOBENATE DIMEGLUMINE 529 MG/ML IV SOLN: 15.0000 mL | Freq: Once | INTRAVENOUS | Status: DC | PRN

## 2016-09-16 NOTE — Telephone Encounter (Signed)
Pt left message stating " I did not get the MRI this morning because it was for the abdomen and that is not where I am having my discomfort "  " I will need an order for the pelvis and also to reschedule my appointment with Dr Jana Hakim "

## 2016-09-24 ENCOUNTER — Telehealth: Payer: Self-pay | Admitting: Oncology

## 2016-09-24 NOTE — Telephone Encounter (Signed)
sw pt to confirm 7/2 appt at 0930 per sch msg

## 2016-09-25 ENCOUNTER — Ambulatory Visit (HOSPITAL_COMMUNITY)
Admission: RE | Admit: 2016-09-25 | Discharge: 2016-09-25 | Disposition: A | Discharge status: Home / Self Care | Payer: BLUE CROSS/BLUE SHIELD | Source: Ambulatory Visit | Attending: Oncology | Admitting: Oncology

## 2016-09-25 ENCOUNTER — Other Ambulatory Visit: Payer: Self-pay | Admitting: *Deleted

## 2016-09-25 ENCOUNTER — Ambulatory Visit (HOSPITAL_COMMUNITY): Admission: RE | Admit: 2016-09-25 | Payer: BLUE CROSS/BLUE SHIELD | Source: Ambulatory Visit

## 2016-09-25 DIAGNOSIS — N289 Disorder of kidney and ureter, unspecified: Secondary | ICD-10-CM | POA: Insufficient documentation

## 2016-09-25 DIAGNOSIS — C50412 Malignant neoplasm of upper-outer quadrant of left female breast: Secondary | ICD-10-CM | POA: Diagnosis present

## 2016-09-25 DIAGNOSIS — K769 Liver disease, unspecified: Secondary | ICD-10-CM | POA: Diagnosis not present

## 2016-09-25 DIAGNOSIS — Z170 Estrogen receptor positive status [ER+]: Secondary | ICD-10-CM | POA: Insufficient documentation

## 2016-09-25 MED ORDER — GADOBENATE DIMEGLUMINE 529 MG/ML IV SOLN
15.0000 mL | Freq: Once | INTRAVENOUS | Status: AC | PRN
  Administered 2016-09-25: 11 mL via INTRAVENOUS

## 2016-09-27 DIAGNOSIS — N289 Disorder of kidney and ureter, unspecified: Secondary | ICD-10-CM | POA: Insufficient documentation

## 2016-09-27 NOTE — Progress Notes (Signed)
[place]  [phone] Fax:([phone]) [place] Note   Patient Care Team: Louretta Shorten, MD as PCP - General (Obstetrics and Gynecology) Louretta Shorten, MD as Consulting Physician (Obstetrics and Gynecology) Jovita Kussmaul, MD as Consulting Physician (General Surgery) Truitt Merle, MD as Consulting Physician (Hematology) Rockwell Germany, RN as Registered Nurse Mauro Kaufmann, RN as Registered Nurse Angelina Ok, MD as Referring Physician (Surgery) Tressa Busman, MD (Plastic Surgery) Ardis Hughs, MD as Attending Physician (Urology) 09/28/2016  CHIEF COMPLAINT: Estrogen receptor positive breast cancer  CURRENT TREATMENT: observation     Malignant neoplasm of upper-outer quadrant of left breast in female, estrogen receptor positive ([place])   06/26/2014 Breast US    A 1 x 0.9 x 0.8 cm irregular hypoechoic mass at the 4 o'clock position 4 cm from the nipple.Suspicious masses at the 12:30 position, 1 o'clock position and 4 o'clock position of the left breast - tissue sampling of thesemasses recommended.       06/26/2014 Breast US    A 1.2 x 1 x 1.3 cm suspicious irregular hypoechoic area/mass at the 12:30 position 2 cm from the nipple, likely representing the architectural distortion identified mammographically. A 0.8 x 0.8 x 0.9 cm irregular hypoechoic mass at the 1 o'clock       07/02/2014 Initial Biopsy    1. Breast, left, needle core biopsy, 1, 12:30 and 4  o'clock position - INVASIVE MAMMARY CARCINOMA. - MAMMARY CARCINOMA IN SITU.       07/02/2014 Receptors her2    Estrogen Receptor: 100%, POSITIVE, STRONG STAINING INTENSITY Progesterone Receptor: 98%, POSITIVE, STRONG STAINING INTENSITY Proliferation Marker Ki67: 10%, HER-2/NEU BY CISH - NEGATIVE.      07/03/2014 Initial Diagnosis    Breast cancer of upper-outer quadrant of left female breast      07/10/2014 Breast MRI    1. Biopsy marker clip artifact in the 12:30 o'clock  position of the left breast at the location of biopsy-proven invasive mammary carcinoma without a separate visible enhancing mass at this time.       07/10/2014 Breast MRI    2. 1.3 cm biopsy-proven invasive mammary carcinoma in the midportion of the upper-outer quadrant of the left breast. 3. 1.3 cm biopsy-proven invasive mammary carcinoma in the posterior aspect of the lower outer quadrant of the left breast.       07/10/2014 Breast MRI    4. 0.8 cm rounded, mildly irregular enhancing mass in the posterior aspect of the upper-outer quadrant of the left breast, 0.8 cm similar-appearing mass in the posterior aspect of the lower outer quadrant of the left breast and adjacent 0.8 cm similar-      07/10/2014 Breast MRI    1.4 cm mildly irregular oval, enhancing mass in the anterior aspect of the lower inner quadrant of the right breast. This is suspicious for the possibility of malignancy.       HISTORY OF PRESENTING ILLNESS:  From Dr. [name] 07/11/2014 intake note:  "Stacy Gallagher 56 y.o. female is here because of newly diagnosed breast cancer  This was discovered by screening mammogram. Her last mammogram was 2 years ago which was normal. Her diagnostic mammogram showed 3 lesions in the left breast, measuring up to 1.3 cm, with the largest distance about 5.3 cm. 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 Ki-67 6-11%. [name] on 07/10/2014 showed 3  additional small lesions in the left breast and a 1.4 cm mass in the right breast, she is scheduled to have a right breast mass biopsy on April 18.   She denies any palpable mass in the breast or axilla before the screening. She feels very well overall, denies any pain, fatigue, change of her appetite or weight loss lately. She denies any other symptoms."  Her subsequent history is as detailed below  INTERVAL HISTORY: [place] today for follow-up of her estrogen receptor positive breast cancer and her left renal lesion. As far as the breast cancer is concerned she is status post bilateral mastectomies with DIEP reconstruction. She has consistently refused adjuvant anti-estrogens.  The left renal lesion was evaluated by urology and Dr. Louis Meckel felt no further follow-up was needed. However the patient has had problems with pelvic discomfort recently and we went ahead and repeated her abdominal MRI. This shows a liver lesion which we have been following to be entirely unchanged and almost certainly benign. The left renal lesion is unchanged but remains indeterminate. A repeat MRI in one year was suggested.   REVIEW OF SYSTEMS [name] finds that when she drinks coffee she gets palpitations and pelvic discomfort. She has pretty much eliminated coffee but she is eating some chocolate which of course also contains theobromines. Because of the pelvic discomfort and 8. (Her first in about a year) she was evaluated by Dr. Corinna Capra with pelvic ultrasonography. I do not have those reports but the patient tells me the uterine lining was fine except for some fibroids. Aside from these issues, Hildred was experiencing some rapid heartbeats at night. These also appeared to have abated. She is exercising 5 days a week. Work is fine. A detailed review of systems today was otherwise stable  MEDICAL HISTORY:  Past Medical History:  Diagnosis Date  . Breast cancer of upper-outer quadrant of left female breast ([place]) 07/03/2014  . S/P mastectomy left    SURGICAL HISTORY: Past Surgical History:  Procedure Laterality Date  . 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 She is divorced. At home it's she and her dad Stacy Gallagher and the patient's younger son Stacy Gallagher,  currently 48 years old. The patient's 31 year old son Stacy Gallagher owns his own business. History   Social History  . Marital Status: Divorced    Spouse Name: N/A  . Number of Children: 2, age of 51 and 37 as of April 2017   . Years of Education: N/A   Occupational History  .  she has her own business of a [employer]  . Smoking status: Never Smoker   . Smokeless tobacco: Not on file  . Alcohol Use: No  . Drug Use: No  . Sexual Activity: Not on file   Other Topics Concern  . Not on file   Social History Narrative    FAMILY HISTORY: Family History  Problem Relation Age of Onset  . Colon cancer Maternal Aunt   . Cancer Maternal Aunt 65       colon cancer   . Throat cancer Maternal Grandmother   . Cancer Maternal Grandmother   The patient's father is alive, at age 100. The patient's mother died at the age of 35 from noncancer related causes. The patient has 2 brothers, 1  sister. There is no history of breast or ovarian cancer in the family.  ALLERGIES:  has No Known Allergies.  MEDICATIONS:  No outpatient encounter prescriptions on file as of 09/28/2016.   No facility-administered encounter medications on file as of 09/28/2016.    PHYSICAL EXAMINATION: ECOG PERFORMANCE STATUS: 1 - Symptomatic but completely ambulatory  Sclerae unicteric, pupils round and equal Oropharynx clear and moist No cervical or supraclavicular adenopathy Lungs no rales or rhonchi Heart regular rate and rhythm Abd soft, nontender, positive bowel sounds MSK no focal spinal tenderness, no upper extremity lymphedema Neuro: nonfocal, well oriented, appropriate affect Breasts: Deferred  LABORATORY DATA:   Lab Results  Component Value Date   WBC 5.9 07/16/2015   HGB 12.9 07/16/2015   HCT 39.3 07/16/2015   MCV 88.3 07/16/2015   PLT 208 07/16/2015   No results for input(s): NA, K, CL, CO2, GLUCOSE, BUN, CREATININE, CALCIUM, GFRNONAA, GFRAA, PROT, ALBUMIN, AST, ALT,  ALKPHOS, BILITOT, BILIDIR, IBILI in the last 8760 hours. STUDIES: Mr Abdomen W Wo Contrast  Result Date: 09/25/2016 CLINICAL DATA:  Renal and liver lesions in a patient with a history of breast cancer. EXAM: MRI ABDOMEN WITHOUT AND WITH CONTRAST TECHNIQUE: Multiplanar multisequence MR imaging of the abdomen was performed both before and after the administration of intravenous contrast. CONTRAST:  63m MULTIHANCE GADOBENATE DIMEGLUMINE 529 MG/ML IV SOLN COMPARISON:  MRI from 02/10/2016 FINDINGS: Lower chest:  Unremarkable. Hepatobiliary: The area of concern in the posterior right liver (segment VI) is completely stable since the prior study. Signal characteristics on precontrast imaging are as before, being slightly hypointense on T1 weighted imaging and slightly hyperintense on T2 weighted imaging. Imaging after IV contrast shows mild hyper enhancement on early phase sequences. 6 mm simple cyst in the central right liver (Also segment VI) is stable. There are extremely tiny cysts in the anterior right liver (segment V) and medial segment left liver, also unchanged. All 3 of these can be visualized on image 21 of series 8. No new or concerning abnormality within the liver parenchyma. There is no evidence for gallstones, gallbladder wall thickening, or pericholecystic fluid. No intrahepatic or extrahepatic biliary dilation. Pancreas: No focal mass lesion. No dilatation of the main duct. No intraparenchymal cyst. No peripancreatic edema. Spleen: No splenomegaly. No focal mass lesion. Adrenals/Urinary Tract: No adrenal nodule or mass. Right kidney unremarkable. 6 x 9 mm subcapsular lesion in the posterior left kidney near the junction of the interpolar and lower pole regions is stable and remains nonspecific. Stomach/Bowel: Stomach is nondistended. No gastric wall thickening. No evidence of outlet obstruction. Duodenum is normally positioned as is the ligament of Treitz. Visualize small bowel loops and colonic  segments of the abdomen are nondilated. Vascular/Lymphatic: No abdominal aortic aneurysm. No abdominal lymphadenopathy. Other: No intraperitoneal free fluid. Musculoskeletal: No abnormal marrow enhancement within the visualized bony anatomy. IMPRESSION: 1. Stable exam. No findings to suggest metastatic disease on today's study. 2. Indeterminate lesion identified previously in segment VI of the liver is completely stable since prior study. Likely benign, this area can be reassessed at the time of followup imaging. 3. Stable tiny hepatic cyst. 4. 9 mm subcapsular lesion lower aspect of the posterior left kidney. This lesion remains indeterminate as a degree of low level enhancement after IV contrast cannot be excluded. The relative stability since the prior study and also an exam from 07/29/2015 is reassuring, but neoplasm such as papillary renal cell carcinoma can show low growth and low-grade enhancement. Consider repeat MRI in  12 months to ensure continued stability. Electronically Signed   By: Misty Stanley M.D.   On: 09/25/2016 14:48    ASSESSMENT:  (1) 56 y.o.  [place] woman status post left breast biopsy 3,  06/29/2014,  for identical invasive mammary carcinomas with lobular features, estrogen receptor and progesterone receptor both strongly positive, MIB-1 in the 6-11% range, with HER-2 not amplified ([id number])  (a) biopsy of an inner lower quadrant right breast lesion was benign and concordant, 07/16/2014  (2) status post left mastectomy and sentinel lymph node sampling 09/12/2014 for an mpT2 pN0, stage IIA  invasive lobular breast cancer (E-cadherin negative, but with some ductal features), grade 2, again estrogen and progesterone receptor positive and HER-2 negative ([id number] at [facility])  (a) deep margin was less than a millimeter, but negative  (b) a total of 2 sentinel lymph nodes were removed  (c) immediate DIEP reconstruction  (d) status post right mastopexy with benign pathology  01/16/2015  (3) adjuvant therapy: the patient opted against adjuvant systemic therapy with tamoxifen  (4) CT angiogram of the abdomen and pelvis on 09/18/2014 showed a 0.9 cm left renal lesion suspicious for renal cell carcinoma, and 2 indeterminate hepatic lesions measuring 1.0 and 0.9 cm.  (a) MRI of the abdomen 07/29/2015 shows the renal lesion to be essentially stable; the single liver lesion noted was indeterminate  (b)  MRI of the abdomen 02/10/2016 shows a liver lesion to be completely stable and the renal lesion to be smaller and less enhancing consistent with a proteinaceous cysts   (c) MRI of the abdomen 09/16/2016 shows the liver lesion and the left kidney lesion 2 be unchanged. 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  PLAN: [name] is now 2 years out from her definitive breast cancer surgery with no evidence of disease recurrence. This is favorable.  As noted above she is not interested in adjuvant anti-estrogens.  We are following her left renal lesion which was incidentally found at the time of her surgery. This remains unchanged. After much discussion we decided we would take one more look, October of next year him a with ultrasonography. Assuming there is no change we will likely stop follow-up at that point  As far as her pelvic discomfort is concerned part of it may be musculoskeletal, related to her exercise program, and part of it may be related to the fact that she still is not fully menopausal and her ovaries may be making some last gasp efforts administration.   As far as her fast heart beats are concerned I showed her how to take her pulse and if this happens again she will count her pulse for a full 60 seconds, and also right day on whether it was regular or irregular. Once this happens to her 3 time she will call me with that information.  She has a good understanding of this plan. She  knows to call for any other problems that may develop before her next visit here.Marland Kitchen

## 2016-09-28 ENCOUNTER — Ambulatory Visit (HOSPITAL_BASED_OUTPATIENT_CLINIC_OR_DEPARTMENT_OTHER): Payer: BLUE CROSS/BLUE SHIELD | Admitting: Oncology

## 2016-09-28 VITALS — BP 122/68 | HR 76 | Temp 98.3°F | Resp 18 | Wt 114.1 lb

## 2016-09-28 DIAGNOSIS — N289 Disorder of kidney and ureter, unspecified: Secondary | ICD-10-CM | POA: Diagnosis not present

## 2016-09-28 DIAGNOSIS — C50412 Malignant neoplasm of upper-outer quadrant of left female breast: Secondary | ICD-10-CM | POA: Diagnosis not present

## 2016-09-28 DIAGNOSIS — Z170 Estrogen receptor positive status [ER+]: Secondary | ICD-10-CM

## 2016-12-17 ENCOUNTER — Other Ambulatory Visit: Payer: Self-pay | Admitting: Obstetrics and Gynecology

## 2016-12-17 DIAGNOSIS — R928 Other abnormal and inconclusive findings on diagnostic imaging of breast: Secondary | ICD-10-CM

## 2016-12-23 ENCOUNTER — Ambulatory Visit
Admission: RE | Admit: 2016-12-23 | Discharge: 2016-12-23 | Disposition: A | Discharge status: Home / Self Care | Payer: BLUE CROSS/BLUE SHIELD | Source: Ambulatory Visit | Attending: Obstetrics and Gynecology | Admitting: Obstetrics and Gynecology

## 2016-12-23 DIAGNOSIS — R928 Other abnormal and inconclusive findings on diagnostic imaging of breast: Secondary | ICD-10-CM

## 2017-02-04 ENCOUNTER — Other Ambulatory Visit: Payer: BLUE CROSS/BLUE SHIELD

## 2017-02-11 ENCOUNTER — Ambulatory Visit: Payer: BLUE CROSS/BLUE SHIELD | Admitting: Oncology

## 2017-02-16 IMAGING — MR MR ABDOMEN WO/W CM
9 of 18 series · 20 of 48 positions shown · IV contrast (multihance)
Comparison: Report from CT[facility] abdomen and pelvis from [facility] dated 08/29/2014

CLINICAL DATA: Breast cancer. Left renal and hepatic lesions. The
report from a prior CTA of the abdomen and pelvis from [facility] revealed a 9 mm arterial enhancing lesion in
the interpolar region of the left kidney suspicious for renal cell
carcinoma, multiple small bilateral renal lesions too small to
characterize, in the indeterminate 9 mm lesion in segment 6 of the
liver, and a 1 cm arterial enhancing lesion in segment 6 of the
liver.

EXAM:
MRI ABDOMEN WITHOUT AND WITH CONTRAST
TECHNIQUE: Multiplanar multisequence MR imaging of the abdomen was performed
both before and after the administration of intravenous contrast.
CONTRAST:  11mL MULTIHANCE GADOBENATE DIMEGLUMINE 529 MG/ML IV SOLN

[Series 3: T2 fat-sat · axial · 5.0mm · 0.74mm/px · z∈[-139,+136]mm · 3 of 56 slices shown]
[im 1/56]
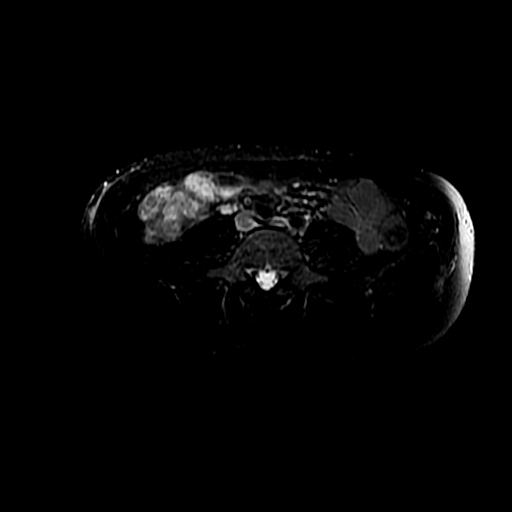
[im 28/56]
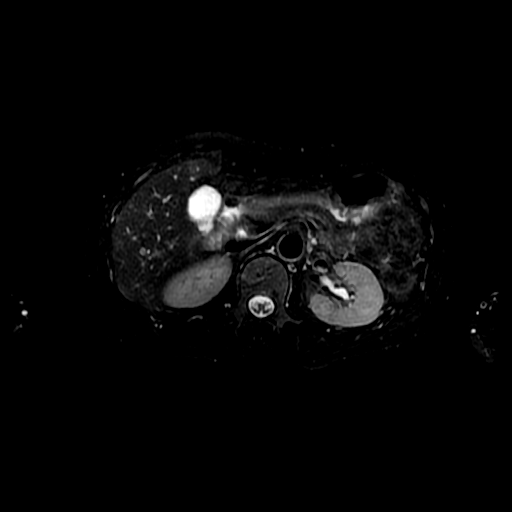
[im 56/56]
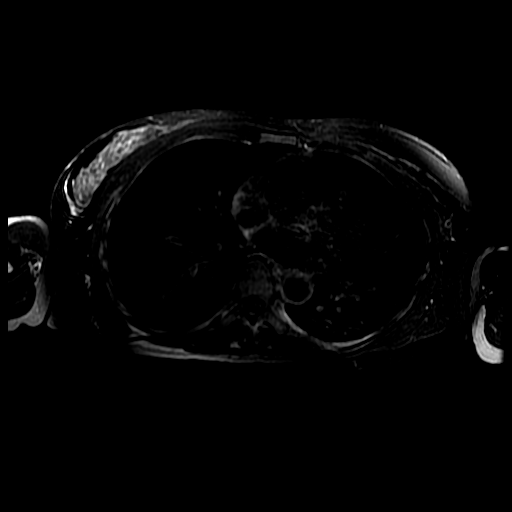

[Series 4: DWI b500 · axial · 6.0mm · 1.48mm/px · z∈[-149,+124]mm · 3 of 72 slices shown]
[im 1/72]
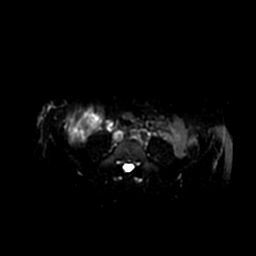
[im 36/72]
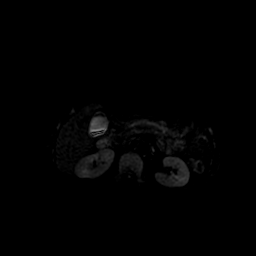
[im 72/72]
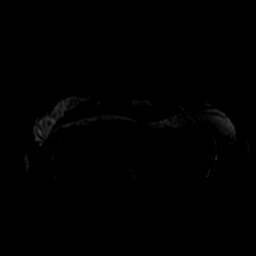

[Series 5: ax dualecho · axial · 5.0mm · 0.74mm/px · z∈[-142,+113]mm · 3 of 104 slices shown]
[im 1/104]
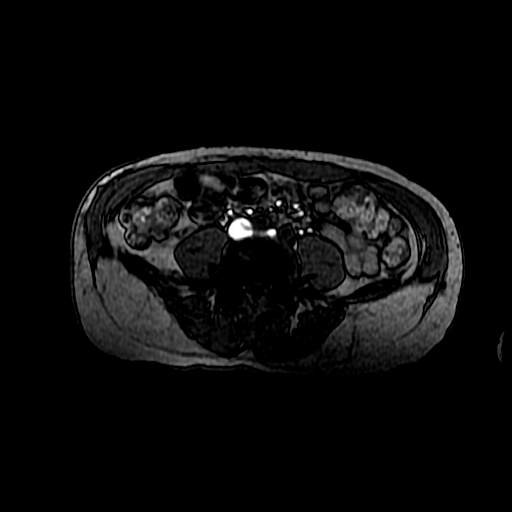
[im 52/104]
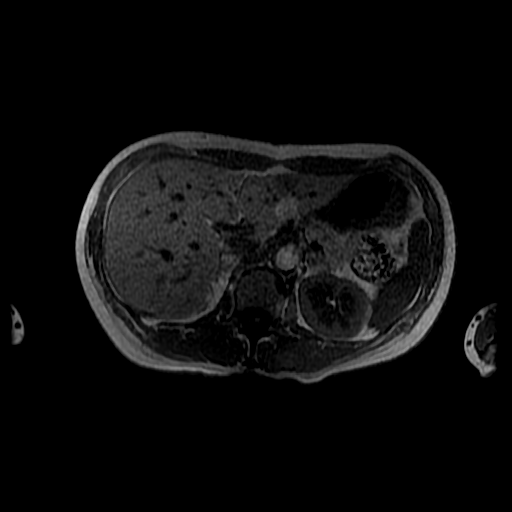
[im 104/104]
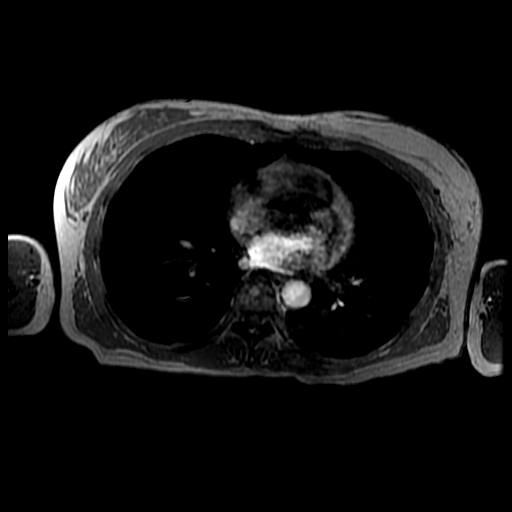

[Series 6: T2 · axial · 5.0mm · 0.74mm/px · z∈[-142,+113]mm · 2 of 52 slices shown (1 of 2)]
[im 1/52]
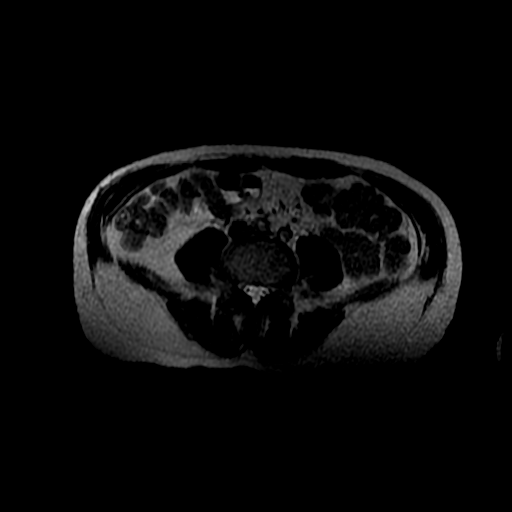
[im 52/52]
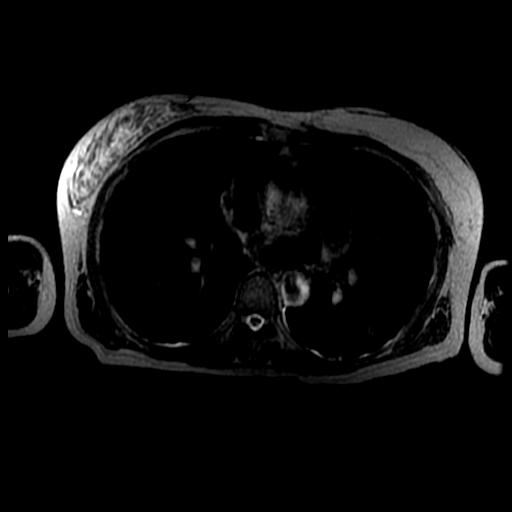

[Series 7: T2 · coronal · 5.0mm · 0.74mm/px · 1 of 35 slices shown (2 of 2)]
[im 1/35]
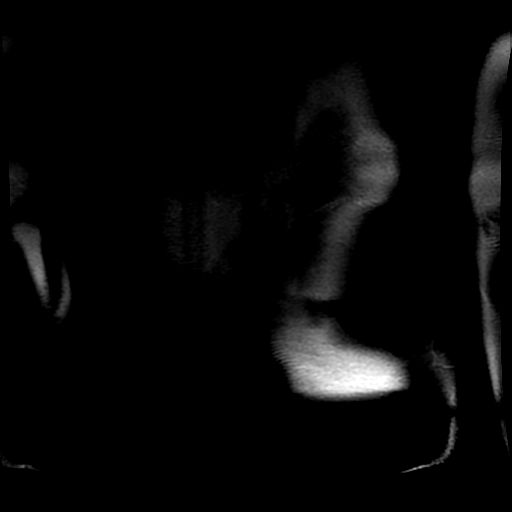

[Series 8: bSSFP · axial · 5.0mm · 0.74mm/px · z∈[-142,+113]mm · 2 of 52 slices shown]
[im 1/52]
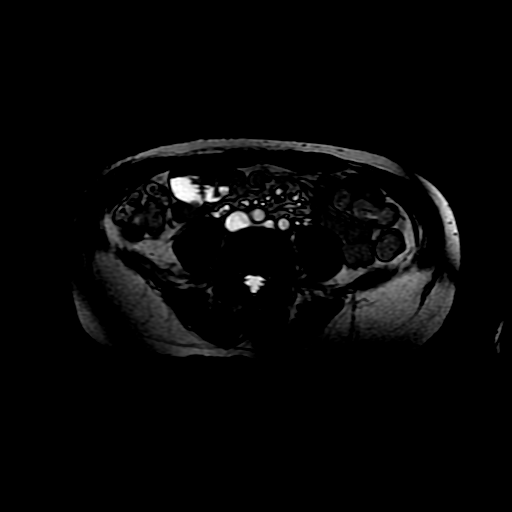
[im 52/52]
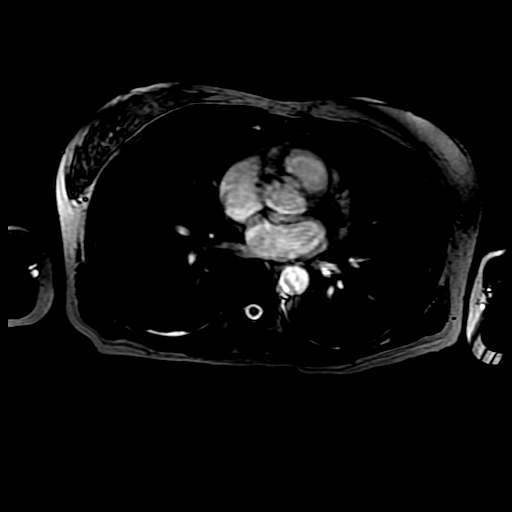

[Series 400: DWI · axial · 6.0mm · 1.48mm/px · 1 of 36 slices shown]
[im 1/36]
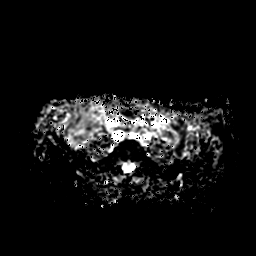

[Series 900: T1 dynamic · axial · 5.0mm · 0.74mm/px · z∈[-170,+108]mm · 3 of 112 slices shown (1 of 2)]
[im 1/112]
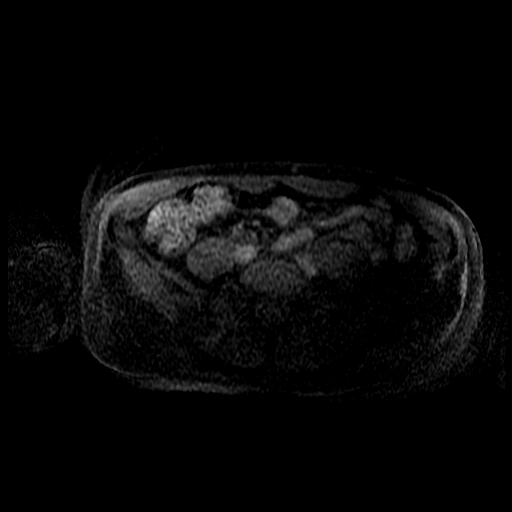
[im 56/112]
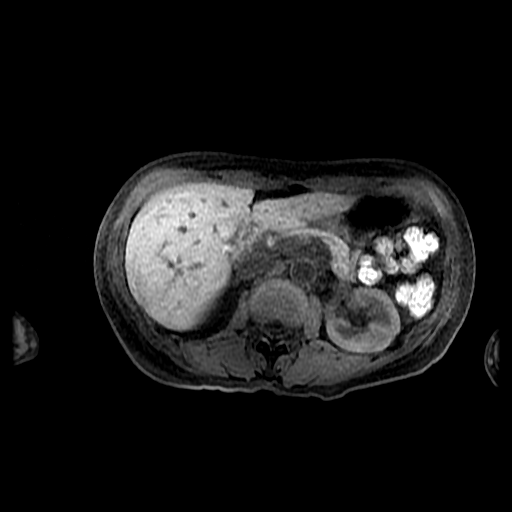
[im 112/112]
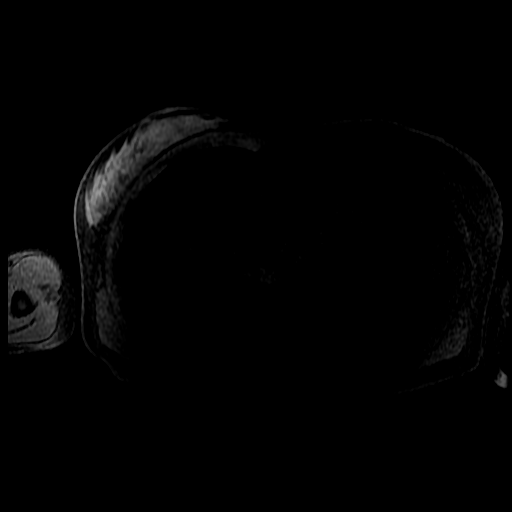

[Series 901: T1 dynamic · axial · 5.0mm · 0.74mm/px · z∈[-170,-32]mm · 2 of 112 slices shown (2 of 2)]
[im 1/112]
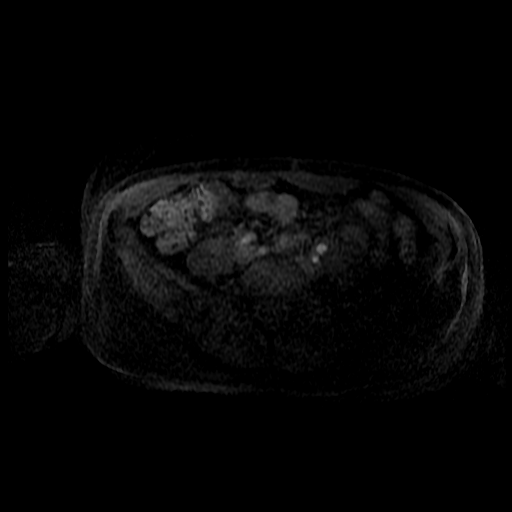
[im 56/112]
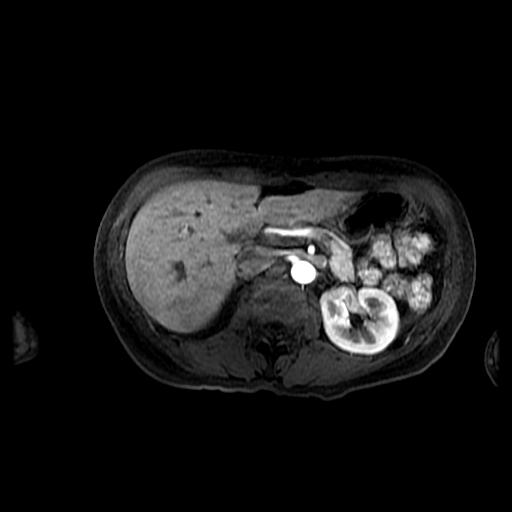

[20 of 48 positions shown; findings below may reference images not displayed]

FINDINGS: Lower chest:  Left mastectomy.

Hepatobiliary: 8 by 8 mm cyst in segment 6 of the liver shown on
image 46/905. Separate from this and along the inferior -
posterior-lateral periphery of segment 6 of the liver, there is a
pyramidal shaped lesion with faintly increased T2 and reduced T1
signal characteristics extending to a small area of capsular
indentation. This second lesion has faintly accentuated arterial
phase enhancement as shown for example on image 61/00100, still
visible although less conspicuous on the later portal venous and
delayed phase images. This lesion measures proximally 1.1 by 2.3 cm.

Pancreas: Pancreas divisum.

Spleen: Unremarkable

Adrenals/Urinary Tract: Along the posteromedial left mid to lower
kidney, there is a 1.0 by 0.9 cm enhancing lesion for example on
image [id number], slightly reduced T2 signal, suspicious for a small
renal cell carcinoma. No additional significant renal abnormality.
Adrenal glands normal.

Stomach/Bowel: Unremarkable

Vascular/Lymphatic: Unremarkable

Other: No supplemental non-categorized findings.

Musculoskeletal: Unremarkable
IMPRESSION: 1. 1.0 by 0.9 cm enhancing lesion posteromedially in the left mid to
lower kidney, likely a small renal cell carcinoma. Based on the
prior report from last [REDACTED], this is not changed in size since that
time.
2. The enhancing lesion in the liver in segment 6 is less specific.
This has a very faint triangular appearance on the axial images
extending to the capsule where there is some very subtle capsular
indentation. This area has high T2 and faintly reduced T1 signal
characteristics and faint generalized enhancement on all sequences
including the arterial phase. Possibilities include scarring or some
type of small focal infiltrative lesion. An unusual area of focal
nodular hyperplasia could have this appearance. The appearance
resembles transient hepatic attenuation difference but given the
persistence on the delayed images this does not relate qualify as
transient. Although this would be a very unusual manifestation of
metastatic breast cancer, surveillance imaging would likely be
prudent to confirm indicate lack of progression. The morphology and
indistinct nature of this lesion makes measuring it difficult, and I
am unsure if today's appearance represents change in size from the
prior.

## 2018-01-10 ENCOUNTER — Inpatient Hospital Stay: Payer: Self-pay | Attending: Obstetrics and Gynecology

## 2018-01-17 ENCOUNTER — Other Ambulatory Visit: Payer: BLUE CROSS/BLUE SHIELD

## 2018-01-17 ENCOUNTER — Ambulatory Visit: Payer: BLUE CROSS/BLUE SHIELD | Admitting: Oncology

## 2018-01-17 ENCOUNTER — Encounter: Payer: Self-pay | Admitting: Oncology

## 2018-01-17 NOTE — Progress Notes (Signed)
Stacy Gallagher  [phone] Fax:(336) [place] Note   Patient Care Team: Louretta Shorten, MD as PCP - General (Obstetrics and Gynecology) Louretta Shorten, MD as Consulting Physician (Obstetrics and Gynecology) Jovita Kussmaul, MD as Consulting Physician (General Surgery) Truitt Merle, MD as Consulting Physician (Hematology) Rockwell Germany, RN as Registered Nurse Mauro Kaufmann, RN as Registered Nurse Angelina Ok, MD as Referring Physician (Surgery) Tressa Busman, MD (Plastic Surgery) Ardis Hughs, MD as Attending Physician (Urology) 01/17/2018  CHIEF COMPLAINT: Estrogen receptor positive breast cancer  CURRENT TREATMENT: observation     Malignant neoplasm of upper-outer quadrant of left breast in female, estrogen receptor positive ([place])   06/26/2014 Breast US    A 1 x 0.9 x 0.8 cm irregular hypoechoic mass at the 4 o'clock position 4 cm from the nipple.Suspicious masses at the 12:30 position, 1 o'clock position and 4 o'clock position of the left breast - tissue sampling of thesemasses recommended.     06/26/2014 Breast US    A 1.2 x 1 x 1.3 cm suspicious irregular hypoechoic area/mass at the 12:30 position 2 cm from the nipple, likely representing the architectural distortion identified mammographically. A 0.8 x 0.8 x 0.9 cm irregular hypoechoic mass at the 1 o'clock     07/02/2014 Initial Biopsy    1. Breast, left, needle core biopsy, 1, 12:30 and 4  o'clock position - INVASIVE MAMMARY CARCINOMA. - MAMMARY CARCINOMA IN SITU.     07/02/2014 Receptors her2    Estrogen Receptor: 100%, POSITIVE, STRONG STAINING INTENSITY Progesterone Receptor: 98%, POSITIVE, STRONG STAINING INTENSITY Proliferation Marker Ki67: 10%, HER-2/NEU BY CISH - NEGATIVE.    07/03/2014 Initial Diagnosis    Breast cancer of upper-outer quadrant of left female breast    07/10/2014 Breast MRI    1. Biopsy marker clip artifact in the 12:30 o'clock position of the  left breast at the location of biopsy-proven invasive mammary carcinoma without a separate visible enhancing mass at this time.     07/10/2014 Breast MRI    2. 1.3 cm biopsy-proven invasive mammary carcinoma in the midportion of the upper-outer quadrant of the left breast. 3. 1.3 cm biopsy-proven invasive mammary carcinoma in the posterior aspect of the lower outer quadrant of the left breast.     07/10/2014 Breast MRI    4. 0.8 cm rounded, mildly irregular enhancing mass in the posterior aspect of the upper-outer quadrant of the left breast, 0.8 cm similar-appearing mass in the posterior aspect of the lower outer quadrant of the left breast and adjacent 0.8 cm similar-    07/10/2014 Breast MRI    1.4 cm mildly irregular oval, enhancing mass in the anterior aspect of the lower inner quadrant of the right breast. This is suspicious for the possibility of malignancy.     HISTORY OF PRESENTING ILLNESS:  From Dr. [name] 07/11/2014 intake note:  "Stacy Gallagher 57 y.o. female is here because of newly diagnosed breast cancer  This was discovered by screening mammogram. Her last mammogram was 2 years ago which was normal. Her diagnostic mammogram showed 3 lesions in the left breast, measuring up to 1.3 cm, with the largest distance about 5.3 cm. 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 Ki-67 6-11%. [name] on 07/10/2014 showed 3 additional small lesions in the left breast and a 1.4 cm mass in the right breast, she is  scheduled to have a right breast mass biopsy on April 18.   She denies any palpable mass in the breast or axilla before the screening. She feels very well overall, denies any pain, fatigue, change of her appetite or weight loss lately. She denies any other symptoms."  Her subsequent history is as detailed below  INTERVAL HISTORY: Stacy Gallagher was a no-show for the  01/17/2018 visit at the cancer center  Since her last visit, she underwent diagnostic bilateral mammography with CAD and tomography and right ultrasonography on 12/23/2016 at [facility] showing: breast density category C. There was no evidence of malignancy. The questioned asymmetry in the inferior aspect of the right breast was resolved with additional imaging.   REVIEW OF SYSTEMS Stacy Gallagher  MEDICAL HISTORY:  Past Medical History:  Diagnosis Date  . Breast cancer of upper-outer quadrant of left female breast ([place]) 07/03/2014  . S/P mastectomy left    SURGICAL HISTORY: Past Surgical History:  Procedure Laterality Date  . DILATATION & CURETTAGE/HYSTEROSCOPY WITH MYOSURE    . 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 At home it's she and her Gallagher Stacy Gallagher and the patient's younger son Stacy Gallagher, currently 6 years old. The patient's 66 year old son Stacy Gallagher owns his own business. History   Social History  . Marital Status: Divorced    Spouse Name: N/A  . Number of Children: 2, age of 95 and 83 as of April 2017   . Years of Education: N/A   Occupational History  .  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 Smoking status: Never Smoker   . Smokeless tobacco: Not on file  . Alcohol Use: No  . Drug Use: No  . Sexual Activity: Not on file   Other Topics Concern  . Not on file   Social History Narrative    FAMILY HISTORY: Family History  Problem Relation Age of Onset  . Colon cancer Maternal Aunt   . Cancer Maternal Aunt 65       colon cancer   . Throat cancer Maternal Grandmother   . Cancer Maternal Grandmother   The patient's father is alive, at age 21. The patient's mother died at the age of 107 from noncancer related causes. The patient has 2 brothers, 1 sister.  There is no history of breast or ovarian cancer in the family.  ALLERGIES:  has No Known Allergies.  MEDICATIONS:  Scheduled Meds: Continuous Infusions: PRN Meds:  PHYSICAL EXAMINATION:   LABORATORY DATA:   Lab Results  Component Value Date   WBC 5.9 07/16/2015   HGB 12.9 07/16/2015   HCT 39.3 07/16/2015   MCV 88.3 07/16/2015   PLT 208 07/16/2015   No results for input(s): NA, K, CL, CO2, GLUCOSE, BUN, CREATININE, CALCIUM, GFRNONAA, GFRAA, PROT, ALBUMIN, AST, ALT, ALKPHOS, BILITOT, BILIDIR, IBILI in the last 8760 hours.    STUDIES: Since her last visit, she underwent diagnostic bilateral mammography with CAD and tomography and right ultrasonography on 12/23/2016 at [facility] showing: breast density category C. There was no evidence of malignancy. The questioned asymmetry in the inferior aspect of the right breast was resolved with additional imaging.     ASSESSMENT:  (1) 57 y.o.  Stacy Gallagher woman status post left breast biopsy 3,  06/29/2014,  for identical invasive mammary carcinomas with lobular features, estrogen receptor and progesterone receptor both strongly positive, MIB-1 in the 6-11% range, with HER-2 not amplified ([id number])  (a) biopsy of an inner lower quadrant right breast lesion was benign and concordant, 07/16/2014  (2) status post left mastectomy and sentinel lymph node sampling 09/12/2014 for an mpT2 pN0, stage IIA  invasive lobular breast cancer (E-cadherin negative, but with some ductal features), grade 2, again estrogen and progesterone receptor positive and HER-2 negative ([id number] at [facility])  (a) deep margin was less than a millimeter, but negative  (b) a total of 2 sentinel lymph nodes were removed  (c) immediate DIEP reconstruction  (d) status post right mastopexy with benign pathology 01/16/2015  (3) adjuvant therapy: the patient opted against adjuvant systemic therapy with tamoxifen  (4) CT angiogram of the abdomen and pelvis on  09/18/2014 showed a 0.9 cm left renal lesion suspicious for renal cell carcinoma, and 2 indeterminate hepatic lesions measuring 1.0 and 0.9 cm.  (a) MRI of the abdomen 07/29/2015 shows the renal lesion to be essentially stable; the single liver lesion noted was indeterminate  (b)  MRI of the abdomen 02/10/2016 shows a liver lesion to be completely stable and the renal lesion to be smaller and less enhancing consistent with a proteinaceous cysts   (c) MRI of the abdomen 09/16/2016 shows the liver lesion and the left kidney lesion 2 be unchanged. 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  PLAN:    Stacy Gallagher, Stacy Dad, MD  01/17/18 8:17 AM Medical Oncology and Hematology [facility] [address] [place], [place] [address] Tel. [phone]    Fax. [phone]  IWilburn Mylar, am acting as scribe for Chauncey Cruel MD.  I, Lurline Del MD, have reviewed the above documentation for accuracy and completeness, and I agree with the above.

## 2018-02-10 ENCOUNTER — Telehealth: Payer: Self-pay

## 2018-02-10 NOTE — Telephone Encounter (Signed)
Spoke with patient concerning the rescheduling of her appointment. Per 11/14 voice message return call. Mailed a letter with a calender enclosed

## 2018-04-03 NOTE — Progress Notes (Signed)
[place]  Telephone:(336) [phone] Fax:(336) [place] Note   Patient Care Team: Louretta Shorten, MD as PCP - General (Obstetrics and Gynecology) Louretta Shorten, MD as Consulting Physician (Obstetrics and Gynecology) Jovita Kussmaul, MD as Consulting Physician (General Surgery) Truitt Merle, MD as Consulting Physician (Hematology) Rockwell Germany, RN as Registered Nurse Mauro Kaufmann, RN as Registered Nurse Angelina Ok, MD as Referring Physician (Surgery) Tressa Busman, MD (Plastic Surgery) Ardis Hughs, MD as Attending Physician (Urology) 04/04/2018  CHIEF COMPLAINT: Estrogen receptor positive breast cancer  CURRENT TREATMENT: observation   HISTORY OF PRESENTING ILLNESS:    Malignant neoplasm of upper-outer quadrant of left breast in female, estrogen receptor positive ([place])   06/26/2014 Breast US    A 1 x 0.9 x 0.8 cm irregular hypoechoic mass at the 4 o'clock position 4 cm from the nipple.Suspicious masses at the 12:30 position, 1 o'clock position and 4 o'clock position of the left breast - tissue sampling of thesemasses recommended.     06/26/2014 Breast US    A 1.2 x 1 x 1.3 cm suspicious irregular hypoechoic area/mass at the 12:30 position 2 cm from the nipple, likely representing the architectural distortion identified mammographically. A 0.8 x 0.8 x 0.9 cm irregular hypoechoic mass at the 1 o'clock     07/02/2014 Initial Biopsy    1. Breast, left, needle core biopsy, 1, 12:30 and 4  o'clock position - INVASIVE MAMMARY CARCINOMA. - MAMMARY CARCINOMA IN SITU.     07/02/2014 Receptors her2    Estrogen Receptor: 100%, POSITIVE, STRONG STAINING INTENSITY Progesterone Receptor: 98%, POSITIVE, STRONG STAINING INTENSITY Proliferation Marker Ki67: 10%, HER-2/NEU BY CISH - NEGATIVE.    07/03/2014 Initial Diagnosis    Breast cancer of upper-outer quadrant of left female breast    07/10/2014 Breast MRI    1. Biopsy marker clip artifact in the  12:30 o'clock position of the left breast at the location of biopsy-proven invasive mammary carcinoma without a separate visible enhancing mass at this time.     07/10/2014 Breast MRI    2. 1.3 cm biopsy-proven invasive mammary carcinoma in the midportion of the upper-outer quadrant of the left breast. 3. 1.3 cm biopsy-proven invasive mammary carcinoma in the posterior aspect of the lower outer quadrant of the left breast.     07/10/2014 Breast MRI    4. 0.8 cm rounded, mildly irregular enhancing mass in the posterior aspect of the upper-outer quadrant of the left breast, 0.8 cm similar-appearing mass in the posterior aspect of the lower outer quadrant of the left breast and adjacent 0.8 cm similar-    07/10/2014 Breast MRI    1.4 cm mildly irregular oval, enhancing mass in the anterior aspect of the lower inner quadrant of the right breast. This is suspicious for the possibility of malignancy.    From Dr. [name] 07/11/2014 intake note:  "Stacy Gallagher 58 y.o. female is here because of newly diagnosed breast cancer  This was discovered by screening mammogram. Her last mammogram was 2 years ago which was normal. Her diagnostic mammogram showed 3 lesions in the left breast, measuring up to 1.3 cm, with the largest distance about 5.3 cm. 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 Ki-67 6-11%. [name] on 07/10/2014 showed 3 additional small lesions in the left breast and a 1.4 cm mass in the right breast, she is scheduled  to have a right breast mass biopsy on April 18.   She denies any palpable mass in the breast or axilla before the screening. She feels very well overall, denies any pain, fatigue, change of her appetite or weight loss lately. She denies any other symptoms."  Her subsequent history is as detailed below   INTERVAL HISTORY: Stacy Gallagher returns today for follow-up and  treatment of of her estrogen receptor positive breast cancer.   The patient continues under observation.  She tells me she is up-to-date on mammography through Dr. Gregor Hams office.  Otherwise the last mammogram we have dates from 12/23/2016 at [facility] showing: Breast Density Category C. There was no mammographic evidence of malignancy. The questioned asymmetry in the inferior aspect of the right breast was resolved with additional imaging.    REVIEW OF SYSTEMS [place]  has been having some pain in her elbows and knees, which is constant, but more so in the morning. Her hands have also been swelling in the morning. For exercise, she lifts and stretches; she does not to tai chi or yoga, however. The patient denies unusual headaches, visual changes, nausea, vomiting, or dizziness. There has been no unusual cough, phlegm production, or pleurisy. This been no change in bowel or bladder habits. The patient denies unexplained fatigue or unexplained weight loss, bleeding, rash, or fever. A detailed review of systems was otherwise noncontributory.    MEDICAL HISTORY:  Past Medical History:  Diagnosis Date  . Breast cancer of upper-outer quadrant of left female breast ([place]) 07/03/2014  . S/P mastectomy left    SURGICAL HISTORY: Past Surgical History:  Procedure Laterality Date  . DILATATION & CURETTAGE/HYSTEROSCOPY WITH MYOSURE    . 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 Stacy Gallagher has 3 kids, all of whom are adults. Stacy Gallagher has an 39 year old son (as of 03/2018), Stacy Gallagher, who received a scholarship to play football in [place] starting in Fall 2020. The patient's 78 year old son (as of 03/2018), Stacy Gallagher, owns his own business.  History    Social History  . Marital Status: Divorced    Spouse Name: N/A  . Number of Children: 2, age of 5 and 58 as of April 2017   . Years of Education: N/A   Occupational History  .  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 Smoking status: Never Smoker   . Smokeless tobacco: Not on file  . Alcohol Use: No  . Drug Use: No  . Sexual Activity: Not on file   Other Topics Concern  . Not on file   Social History Narrative    FAMILY HISTORY: Family History  Problem Relation Age of Onset  . Colon cancer Maternal Aunt   . Cancer Maternal Aunt 65       colon cancer   . Throat cancer Maternal Grandmother   . Cancer Maternal Grandmother    The patient's father is alive, at age 62. The patient's mother died at the age of 31 from noncancer related causes. The patient has 2 brothers, 1 sister. There is no history of breast or ovarian cancer in the family.  ALLERGIES:  has No Known Allergies.  MEDICATIONS:  Scheduled Meds: Continuous Infusions: PRN Meds:  PHYSICAL EXAMINATION: Today's Vitals   04/04/18 1410  BP: 128/85  Pulse: 70  Resp: 18  Temp: 98.7 F (37.1 C)  TempSrc: Oral  SpO2: 99%  Weight: 117 lb 9.6 oz (53.3 kg)  Sclerae unicteric, EOMs intact No cervical or supraclavicular adenopathy Lungs no rales or rhonchi Heart regular rate and rhythm Abd soft, nontender, positive bowel sounds MSK no focal spinal tenderness, no upper extremity lymphedema Neuro: nonfocal, well oriented, appropriate affect Breasts: I do not palpate any suspicious masses in either breast.  Both axillae are benign.    LABORATORY DATA:   Lab Results  Component Value Date   WBC 4.5 04/04/2018   HGB 13.6 04/04/2018   HCT 40.2 04/04/2018   MCV 88.0 04/04/2018   PLT 244 04/04/2018   No results for input(s): NA, K, CL, CO2, GLUCOSE, BUN, CREATININE, CALCIUM, GFRNONAA, GFRAA, PROT, ALBUMIN, AST, ALT, ALKPHOS, BILITOT, BILIDIR, IBILI in the last 8760  hours.    STUDIES: No results found.   ASSESSMENT:  (1) 58 y.o.  [place] woman status post left breast biopsy 3,  06/29/2014,  for identical invasive mammary carcinomas with lobular features, estrogen receptor and progesterone receptor both strongly positive, MIB-1 in the 6-11% range, with HER-2 not amplified ([id number]) (a) biopsy of an inner lower quadrant right breast lesion was benign and concordant, 07/16/2014  (2) status post left mastectomy and sentinel lymph node sampling 09/12/2014 for an mpT2 pN0, stage IIA  invasive lobular breast cancer (E-cadherin negative, but with some ductal features), grade 2, again estrogen and progesterone receptor positive and HER-2 negative ([id number] at [facility])  (a) deep margin was less than a millimeter, but negative  (b) a total of 2 sentinel lymph nodes were removed  (c) immediate DIEP reconstruction  (d) status post right mastopexy with benign pathology 01/16/2015  (3) adjuvant therapy: the patient opted against adjuvant systemic therapy with tamoxifen  (4) CT angiogram of the abdomen and pelvis on 09/18/2014 showed a 0.9 cm left renal lesion suspicious for renal cell carcinoma, and 2 indeterminate hepatic lesions measuring 1.0 and 0.9 cm. (a) MRI of the abdomen 07/29/2015 shows the renal lesion to be essentially stable; the single liver lesion noted was indeterminate (b)  MRI of the abdomen 02/10/2016 shows the liver lesion to be completely stable and the renal lesion to be smaller and less enhancing consistent with a proteinaceous cysts   (c) MRI of the abdomen 09/16/2016 shows the liver lesion and the left kidney lesion 2 be unchanged. 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  PLAN: Stacy Gallagher is now 3-1/2 years out from definitive surgery for her breast cancer with no evidence of disease recurrence.  This is favorable.  Since we have had multiple discussions  in the past it is surprising to me that she still did not understand that breast cancer can move to the liver lungs or bones and that she is at risk for this.  I specifically quoted a risk of developing recurrence outside the breast of approximately 18%.  She also knows that she has a 1 %/year risk of developing a new breast cancer in either breast.  Since she can reasonably be expected to live another 25 to 30 years, this means she has a 1 in 4 or so chance of developing breast cancer again.  She is aware of the fact that antiestrogens taken daily for 5  years would cut those risks in half.  We discussed tamoxifen again in detail and the new data suggesting that 10 mg of tamoxifen, which is half the usual dose, may be as effective with fewer side effects.  She is not interested in proceeding with tamoxifen.  She is really here today because she is having some problems in her elbows hands and knees.  She is afraid she may be developing rheumatoid arthritis which her mother had.  It really sounds like osteoarthritis to me but she would like referral to a rheumatologist and we can arrange for that for her.  She is agreeable to an MRI of the pelvis which I have scheduled for June of this year.  If this is stable I would probably stop obtaining these.  I have made her 1 final appointment with me for March 2021.  At that point very likely she will "graduate" assuming she has established herself with a primary care physician by then.  Knows to call for any other issue that may develop before the next visit.   , Virgie Dad, MD  04/04/18 2:45 PM Medical Oncology and Hematology [facility] [address] [place], [place] [address] Tel. [phone]    Fax. [phone]   I, Jacqualyn Posey am acting as a Education administrator for Chauncey Cruel, MD.   I, Lurline Del MD, have reviewed the above documentation for accuracy and completeness, and I agree with the above.

## 2018-04-04 ENCOUNTER — Inpatient Hospital Stay: Payer: BLUE CROSS/BLUE SHIELD

## 2018-04-04 ENCOUNTER — Encounter: Payer: Self-pay | Admitting: Oncology

## 2018-04-04 ENCOUNTER — Telehealth: Payer: Self-pay | Admitting: Oncology

## 2018-04-04 ENCOUNTER — Inpatient Hospital Stay: Payer: BLUE CROSS/BLUE SHIELD | Attending: Obstetrics and Gynecology | Admitting: Oncology

## 2018-04-04 VITALS — BP 128/85 | HR 70 | Temp 98.7°F | Resp 18 | Wt 117.6 lb

## 2018-04-04 DIAGNOSIS — Z853 Personal history of malignant neoplasm of breast: Secondary | ICD-10-CM | POA: Diagnosis present

## 2018-04-04 DIAGNOSIS — C50412 Malignant neoplasm of upper-outer quadrant of left female breast: Secondary | ICD-10-CM

## 2018-04-04 DIAGNOSIS — K769 Liver disease, unspecified: Secondary | ICD-10-CM | POA: Diagnosis not present

## 2018-04-04 DIAGNOSIS — N289 Disorder of kidney and ureter, unspecified: Secondary | ICD-10-CM | POA: Diagnosis not present

## 2018-04-04 DIAGNOSIS — Z170 Estrogen receptor positive status [ER+]: Secondary | ICD-10-CM | POA: Diagnosis not present

## 2018-04-04 DIAGNOSIS — M1990 Unspecified osteoarthritis, unspecified site: Secondary | ICD-10-CM

## 2018-04-04 DIAGNOSIS — Z9012 Acquired absence of left breast and nipple: Secondary | ICD-10-CM | POA: Diagnosis not present

## 2018-04-04 LAB — COMPREHENSIVE METABOLIC PANEL
ALT: 15 U/L (ref 0–44)
AST: 23 U/L (ref 15–41)
Albumin: 3.8 g/dL (ref 3.5–5.0)
Alkaline Phosphatase: 67 U/L (ref 38–126)
Anion gap: 8 (ref 5–15)
BILIRUBIN TOTAL: 0.4 mg/dL (ref 0.3–1.2)
BUN: 15 mg/dL (ref 6–20)
CO2: 33 mmol/L — ABNORMAL HIGH (ref 22–32)
Calcium: 9.7 mg/dL (ref 8.9–10.3)
Chloride: 103 mmol/L (ref 98–111)
Creatinine, Ser: 0.78 mg/dL (ref 0.44–1.00)
GFR calc Af Amer: >60 mL/min (ref >60)
Glucose, Bld: 97 mg/dL (ref 70–99)
POTASSIUM: 3.9 mmol/L (ref 3.5–5.1)
Sodium: 144 mmol/L (ref 135–145)
Total Protein: 6.7 g/dL (ref 6.5–8.1)

## 2018-04-04 LAB — CBC WITH DIFFERENTIAL/PLATELET
ABS IMMATURE GRANULOCYTES: 0.01 10*3/uL (ref 0.00–0.07)
Basophils Absolute: 0 10*3/uL (ref 0.0–0.1)
Basophils Relative: 0 %
Eosinophils Absolute: 0.1 10*3/uL (ref 0.0–0.5)
Eosinophils Relative: 3 %
HCT: 40.2 % (ref 36.0–46.0)
Hemoglobin: 13.6 g/dL (ref 12.0–15.0)
Immature Granulocytes: 0 %
LYMPHS PCT: 45 %
Lymphs Abs: 2 10*3/uL (ref 0.7–4.0)
MCH: 29.8 pg (ref 26.0–34.0)
MCHC: 33.8 g/dL (ref 30.0–36.0)
MCV: 88 fL (ref 80.0–100.0)
Monocytes Absolute: 0.2 10*3/uL (ref 0.1–1.0)
Monocytes Relative: 5 %
NEUTROS ABS: 2.1 10*3/uL (ref 1.7–7.7)
Neutrophils Relative %: 47 %
Platelets: 244 10*3/uL (ref 150–400)
RBC: 4.57 MIL/uL (ref 3.87–5.11)
RDW: 11.6 % (ref 11.5–15.5)
WBC: 4.5 10*3/uL (ref 4.0–10.5)
nRBC: 0 % (ref 0.0–0.2)

## 2018-04-04 NOTE — Telephone Encounter (Signed)
Gave avs and calendar ° °

## 2018-04-06 ENCOUNTER — Telehealth: Payer: Self-pay | Admitting: *Deleted

## 2018-04-06 NOTE — Telephone Encounter (Signed)
Medical records faxed to [facility] - [facility]; RID [id number]

## 2018-09-07 ENCOUNTER — Ambulatory Visit (HOSPITAL_COMMUNITY): Payer: BC Managed Care – PPO

## 2018-09-07 ENCOUNTER — Other Ambulatory Visit: Payer: Self-pay | Admitting: Oncology

## 2018-09-13 ENCOUNTER — Other Ambulatory Visit: Payer: Self-pay | Admitting: Oncology

## 2018-09-13 ENCOUNTER — Ambulatory Visit (HOSPITAL_COMMUNITY): Admission: RE | Admit: 2018-09-13 | Payer: BC Managed Care – PPO | Source: Ambulatory Visit

## 2018-09-13 ENCOUNTER — Other Ambulatory Visit: Payer: Self-pay | Admitting: *Deleted

## 2018-09-13 DIAGNOSIS — N2889 Other specified disorders of kidney and ureter: Secondary | ICD-10-CM

## 2018-09-13 DIAGNOSIS — Z170 Estrogen receptor positive status [ER+]: Secondary | ICD-10-CM

## 2018-09-13 DIAGNOSIS — C50412 Malignant neoplasm of upper-outer quadrant of left female breast: Secondary | ICD-10-CM

## 2019-06-05 ENCOUNTER — Inpatient Hospital Stay: Payer: BC Managed Care – PPO | Attending: Oncology

## 2019-06-05 ENCOUNTER — Other Ambulatory Visit: Payer: Self-pay

## 2019-06-05 DIAGNOSIS — Z853 Personal history of malignant neoplasm of breast: Secondary | ICD-10-CM | POA: Insufficient documentation

## 2019-06-05 DIAGNOSIS — C50412 Malignant neoplasm of upper-outer quadrant of left female breast: Secondary | ICD-10-CM

## 2019-06-05 DIAGNOSIS — M1990 Unspecified osteoarthritis, unspecified site: Secondary | ICD-10-CM

## 2019-06-05 DIAGNOSIS — Z9012 Acquired absence of left breast and nipple: Secondary | ICD-10-CM | POA: Diagnosis not present

## 2019-06-05 LAB — COMPREHENSIVE METABOLIC PANEL
ALT: 21 U/L (ref 0–44)
AST: 26 U/L (ref 15–41)
Albumin: 4 g/dL (ref 3.5–5.0)
Alkaline Phosphatase: 65 U/L (ref 38–126)
Anion gap: 9 (ref 5–15)
BUN: 14 mg/dL (ref 6–20)
CO2: 29 mmol/L (ref 22–32)
Calcium: 9.2 mg/dL (ref 8.9–10.3)
Chloride: 106 mmol/L (ref 98–111)
Creatinine, Ser: 0.67 mg/dL (ref 0.44–1.00)
GFR calc Af Amer: >60 mL/min (ref >60)
GFR calc non Af Amer: >60 mL/min (ref >60)
Glucose, Bld: 98 mg/dL (ref 70–99)
Potassium: 3.6 mmol/L (ref 3.5–5.1)
Sodium: 144 mmol/L (ref 135–145)
Total Bilirubin: 0.4 mg/dL (ref 0.3–1.2)
Total Protein: 6.6 g/dL (ref 6.5–8.1)

## 2019-06-05 LAB — CBC WITH DIFFERENTIAL/PLATELET
Abs Immature Granulocytes: 0 10*3/uL (ref 0.00–0.07)
Basophils Absolute: 0 10*3/uL (ref 0.0–0.1)
Basophils Relative: 1 %
Eosinophils Absolute: 0.1 10*3/uL (ref 0.0–0.5)
Eosinophils Relative: 3 %
HCT: 38.2 % (ref 36.0–46.0)
Hemoglobin: 13.1 g/dL (ref 12.0–15.0)
Immature Granulocytes: 0 %
Lymphocytes Relative: 44 %
Lymphs Abs: 1.8 10*3/uL (ref 0.7–4.0)
MCH: 30.2 pg (ref 26.0–34.0)
MCHC: 34.3 g/dL (ref 30.0–36.0)
MCV: 88 fL (ref 80.0–100.0)
Monocytes Absolute: 0.2 10*3/uL (ref 0.1–1.0)
Monocytes Relative: 4 %
Neutro Abs: 2 10*3/uL (ref 1.7–7.7)
Neutrophils Relative %: 48 %
Platelets: 238 10*3/uL (ref 150–400)
RBC: 4.34 MIL/uL (ref 3.87–5.11)
RDW: 11.5 % (ref 11.5–15.5)
WBC: 4.2 10*3/uL (ref 4.0–10.5)
nRBC: 0 % (ref 0.0–0.2)

## 2019-06-05 LAB — SEDIMENTATION RATE: Sed Rate: 3 mm/hr (ref 0–22)

## 2019-06-06 LAB — ANTINUCLEAR ANTIBODIES, IFA: ANA Ab, IFA: NEGATIVE

## 2019-06-07 NOTE — Progress Notes (Signed)
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 She continues under observation.  She underwent her right mammograms at Dr. Gregor Hams office "a few weeks ago".  I do not have that report but per the patient it was normal.  She had been set up for a repeat pelvic MRI but she says when they called her there was some issue regarding insurance and it was never done.   REVIEW OF SYSTEMS Lakeyn exercises 3-4 times a week at the gym.  She also takes walks with her husband.  She is extremely busy at work.  The aches and pains she was having previously are now much improved, with no intervening treatment.  She was evaluated for this by Dr. Lenna Gilford who did not find any evidence of rheumatoid arthritis.   HISTORY OF PRESENTING ILLNESS:  From Dr. [name] 07/11/2014 intake note:  "Selena Lesser 59 y.o. female is here because of newly diagnosed breast cancer  This was discovered by screening mammogram. Her last mammogram was 2 years ago which was normal. Her diagnostic mammogram showed 3 lesions in the left breast, measuring up to 1.3 cm, with the largest distance about 5.3 cm. She underwent left breast biopsy of these 3 lesions which  showed similar morphology, invasive ductal carcinoma and DCIS, grade 2, ER/PR positive HER-2 negative. Ki-67 6-11%. [name] on 07/10/2014 showed 3 additional small lesions in the left breast and a 1.4 cm mass in the right breast, she is scheduled to have a right breast mass biopsy on April 18.   She denies any palpable mass in the breast or axilla before the screening. She feels very well overall, denies any pain, fatigue, change of her appetite or weight loss lately. She denies any other symptoms."  Her subsequent history is as detailed below   MEDICAL HISTORY:  Past Medical History:  Diagnosis Date  . Breast cancer of upper-outer quadrant of left female breast ([place]) 07/03/2014  . S/P mastectomy left    SURGICAL HISTORY: Past Surgical History:  Procedure Laterality Date  . DILATATION & CURETTAGE/HYSTEROSCOPY WITH MYOSURE    . MASTECTOMY Left 08/2014    FAMILY HISTORY: Family History  Problem Relation Age of Onset  . Colon cancer Maternal Aunt   . Cancer Maternal Aunt 65       colon cancer   . Throat cancer Maternal Grandmother   . Cancer Maternal Grandmother    The patient's father is alive, at age 84. The patient's mother died at the age of 63 from noncancer related causes. The patient has 2 brothers, 1 sister. There is no history of breast or ovarian cancer in the family.   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 She  was married at the end of 2019 to [employer] who works for [employer]. Deron has 3 kids, all of whom are adults. Odell has an 51 year old son (as of 03/2018), [name], who received a scholarship to play football in [place] starting in Fall 2020. The patient's 30 year old son (as of 03/2018), [name], owns his own business.   ADVANCED DIRECTIVES: In the absence of any documentation to the contrary, the  patient's spouse is their HCPOA.    HEALTH MAINTENANCE: Social History   Tobacco Use  . Smoking status: Never Smoker  Substance Use Topics  . Alcohol use: No  . Drug use: No    No Known Allergies  No current outpatient medications on file.   No current facility-administered medications for this visit.    PHYSICAL EXAMINATION: Middle-aged woman who appears younger than stated age 51 Vitals   06/08/19 1419  BP: 107/77  Pulse: 86  Resp: 17  Temp: 98.2 F (36.8 C)  TempSrc: Temporal  SpO2: 90%  Weight: 124 lb 9.6 oz (56.5 kg)  Height: 5' 4.5" (1.638 m)     Sclerae unicteric, EOMs intact Wearing a mask No cervical or supraclavicular adenopathy Lungs no rales or rhonchi Heart regular rate and rhythm Abd soft, nontender, positive bowel sounds MSK no focal spinal tenderness, no upper extremity lymphedema Neuro: nonfocal, well oriented, appropriate affect Breasts: The right breast is status post reduction.  There is no suspicious finding.  The left breast is status post mastectomy with D IEP reconstruction.  There is no evidence of local recurrence.  Both axillae are benign.   LABORATORY DATA:   Lab Results  Component Value Date   WBC 4.2 06/05/2019   HGB 13.1 06/05/2019   HCT 38.2 06/05/2019   MCV 88.0 06/05/2019   PLT 238 06/05/2019   Recent Labs    06/05/19 1128  NA 144  K 3.6  CL 106  CO2 29  GLUCOSE 98  BUN 14  CREATININE 0.67  CALCIUM 9.2  GFRNONAA >60  GFRAA >60  PROT 6.6  ALBUMIN 4.0  AST 26  ALT 21  ALKPHOS 65  BILITOT 0.4     STUDIES: No results found.   ASSESSMENT:  (1) 60 y.o.  [place] woman status post left breast biopsy 3,  06/29/2014,  for identical invasive mammary carcinomas with lobular features, estrogen receptor and progesterone receptor both strongly positive, MIB-1 in the 6-11% range, with HER-2 not amplified ([id number]) (a) biopsy of an inner lower quadrant right breast lesion was benign and concordant,  07/16/2014  (2) status post left mastectomy and sentinel lymph node sampling 09/12/2014 for an mpT2 pN0, stage IIA  invasive lobular breast cancer (E-cadherin negative, but with some ductal features), grade 2, again estrogen and progesterone receptor positive and HER-2 negative ([id number] at [facility])  (a) deep margin was less than a millimeter, but negative  (b) a total of 2 sentinel lymph nodes were removed  (c) immediate DIEP reconstruction  (d) status post right mastopexy with benign pathology 01/16/2015  (3) adjuvant therapy: the patient opted against adjuvant systemic therapy with tamoxifen  (4) CT angiogram of the abdomen and pelvis on 09/18/2014 showed a 0.9 cm left renal lesion suspicious for renal cell carcinoma, and 2 indeterminate hepatic lesions measuring 1.0 and 0.9 cm. (a) MRI of the abdomen 07/29/2015 shows the renal lesion to be essentially stable; the single liver lesion noted was indeterminate (b)  MRI of the abdomen 02/10/2016 shows the liver lesion to be completely stable and  the renal lesion to be smaller and less enhancing consistent with a proteinaceous cysts   (c) MRI of the abdomen 09/16/2016 shows the liver lesion and the left kidney lesion 2 be unchanged. 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   PLAN: [name] is now just about 5 years out from definitive surgery for her breast cancer with no evidence of disease recurrence.  This is very favorable.  At this point I feel comfortable releasing her from follow-up.  She understands that she remains at risk of recurrence and she should contact us if any new symptom develops that is at all worrisome to her  The one unresolved issue is the left renal lesion.  This was followed over.  Of over 2 years with no obvious change, but she has had no imaging for 3 years.  I suggested we could either do an MRI and if it still stable consider no further  evaluation, will refer to urology and she preferred the latter which is also I think a better idea.  I have placed that referral today.  Otherwise I am comfortable releasing [name] to her primary care physician, who is also her gynecologist.  All she will need as far as breast cancer is concerned is the right sided mammography.  I will be glad to see her again at any time in the future if and when the need arises but as of now are making no further routine appointments for her here.  Total encounter time 30 minutes.*  Kerstin Crusoe, Virgie Dad, MD  06/08/19 2:34 PM Medical Oncology and Hematology [facility] [address], [place] [address] Tel. [phone]    Fax. [phone]   I, Wilburn Mylar, am acting as scribe for Dr. Virgie Dad. Tashala Cumbo.  I, Lurline Del MD, have reviewed the above documentation for accuracy and completeness, and I agree with the above.    *Total Encounter Time as defined by the Centers for Medicare and Medicaid Services includes, in addition to the face-to-face time of a patient visit (documented in the note above) non-face-to-face time: obtaining and reviewing outside history, ordering and reviewing medications, tests or procedures, care coordination (communications with other health care professionals or caregivers) and documentation in the medical record.

## 2019-06-08 ENCOUNTER — Inpatient Hospital Stay (HOSPITAL_BASED_OUTPATIENT_CLINIC_OR_DEPARTMENT_OTHER): Payer: BC Managed Care – PPO | Admitting: Oncology

## 2019-06-08 ENCOUNTER — Encounter: Payer: Self-pay | Admitting: Oncology

## 2019-06-08 ENCOUNTER — Other Ambulatory Visit: Payer: Self-pay

## 2019-06-08 VITALS — BP 107/77 | HR 86 | Temp 98.2°F | Resp 17 | Ht 64.5 in | Wt 124.6 lb

## 2019-06-08 DIAGNOSIS — Z853 Personal history of malignant neoplasm of breast: Secondary | ICD-10-CM | POA: Diagnosis not present

## 2019-06-08 DIAGNOSIS — Z170 Estrogen receptor positive status [ER+]: Secondary | ICD-10-CM

## 2019-06-08 DIAGNOSIS — N2889 Other specified disorders of kidney and ureter: Secondary | ICD-10-CM | POA: Diagnosis not present

## 2019-06-08 DIAGNOSIS — C50412 Malignant neoplasm of upper-outer quadrant of left female breast: Secondary | ICD-10-CM
